# Patient Record
Sex: Female | Born: 1960 | Race: Black or African American | Hispanic: No | Marital: Married | Smoking: Never smoker
Health system: Southern US, Community
[De-identification: ages and names within clinical notes are randomized; demographics above are authoritative.]

## PROBLEM LIST (undated history)

## (undated) DIAGNOSIS — E785 Hyperlipidemia, unspecified: Secondary | ICD-10-CM

## (undated) DIAGNOSIS — Z9889 Other specified postprocedural states: Secondary | ICD-10-CM

## (undated) DIAGNOSIS — I10 Essential (primary) hypertension: Secondary | ICD-10-CM

## (undated) DIAGNOSIS — J189 Pneumonia, unspecified organism: Secondary | ICD-10-CM

## (undated) DIAGNOSIS — R112 Nausea with vomiting, unspecified: Secondary | ICD-10-CM

## (undated) DIAGNOSIS — E039 Hypothyroidism, unspecified: Secondary | ICD-10-CM

## (undated) DIAGNOSIS — R7303 Prediabetes: Secondary | ICD-10-CM

## (undated) DIAGNOSIS — E041 Nontoxic single thyroid nodule: Secondary | ICD-10-CM

## (undated) HISTORY — PX: APPENDECTOMY: SHX54

## (undated) HISTORY — PX: ABDOMINAL HYSTERECTOMY: SHX81

## (undated) HISTORY — DX: Hyperlipidemia, unspecified: E78.5

## (undated) HISTORY — DX: Nontoxic single thyroid nodule: E04.1

---

## 1997-09-23 ENCOUNTER — Other Ambulatory Visit: Admission: RE | Admit: 1997-09-23 | Discharge: 1997-09-23 | Payer: Self-pay | Admitting: Gynecology

## 1997-10-16 ENCOUNTER — Ambulatory Visit (HOSPITAL_COMMUNITY): Admission: RE | Admit: 1997-10-16 | Discharge: 1997-10-16 | Payer: Self-pay | Admitting: Gynecology

## 1998-12-15 ENCOUNTER — Other Ambulatory Visit: Admission: RE | Admit: 1998-12-15 | Discharge: 1998-12-15 | Payer: Self-pay | Admitting: Gynecology

## 1999-12-16 ENCOUNTER — Other Ambulatory Visit: Admission: RE | Admit: 1999-12-16 | Discharge: 1999-12-16 | Payer: Self-pay | Admitting: Gynecology

## 2001-05-08 ENCOUNTER — Other Ambulatory Visit: Admission: RE | Admit: 2001-05-08 | Discharge: 2001-05-08 | Payer: Self-pay | Admitting: Gynecology

## 2002-06-18 ENCOUNTER — Other Ambulatory Visit: Admission: RE | Admit: 2002-06-18 | Discharge: 2002-06-18 | Payer: Self-pay | Admitting: Gynecology

## 2002-07-26 ENCOUNTER — Encounter (INDEPENDENT_AMBULATORY_CARE_PROVIDER_SITE_OTHER): Payer: Self-pay | Admitting: Specialist

## 2002-07-26 ENCOUNTER — Ambulatory Visit (HOSPITAL_BASED_OUTPATIENT_CLINIC_OR_DEPARTMENT_OTHER): Admission: RE | Admit: 2002-07-26 | Discharge: 2002-07-26 | Payer: Self-pay | Admitting: Gynecology

## 2002-09-20 ENCOUNTER — Encounter (INDEPENDENT_AMBULATORY_CARE_PROVIDER_SITE_OTHER): Payer: Self-pay

## 2002-09-20 ENCOUNTER — Observation Stay (HOSPITAL_COMMUNITY): Admission: RE | Admit: 2002-09-20 | Discharge: 2002-09-21 | Payer: Self-pay | Admitting: Gynecology

## 2003-05-19 ENCOUNTER — Other Ambulatory Visit: Admission: RE | Admit: 2003-05-19 | Discharge: 2003-05-19 | Payer: Self-pay | Admitting: Gynecology

## 2004-05-19 ENCOUNTER — Other Ambulatory Visit: Admission: RE | Admit: 2004-05-19 | Discharge: 2004-05-19 | Payer: Self-pay | Admitting: Gynecology

## 2005-05-20 ENCOUNTER — Other Ambulatory Visit: Admission: RE | Admit: 2005-05-20 | Discharge: 2005-05-20 | Payer: Self-pay | Admitting: Gynecology

## 2005-07-12 ENCOUNTER — Encounter: Admission: RE | Admit: 2005-07-12 | Discharge: 2005-07-12 | Payer: Self-pay | Admitting: Gastroenterology

## 2006-01-10 ENCOUNTER — Encounter (INDEPENDENT_AMBULATORY_CARE_PROVIDER_SITE_OTHER): Payer: Self-pay | Admitting: Specialist

## 2006-01-10 ENCOUNTER — Other Ambulatory Visit: Admission: RE | Admit: 2006-01-10 | Discharge: 2006-01-10 | Payer: Self-pay | Admitting: Interventional Radiology

## 2006-01-10 ENCOUNTER — Encounter: Admission: RE | Admit: 2006-01-10 | Discharge: 2006-01-10 | Payer: Self-pay | Admitting: Emergency Medicine

## 2006-06-28 ENCOUNTER — Other Ambulatory Visit: Admission: RE | Admit: 2006-06-28 | Discharge: 2006-06-28 | Payer: Self-pay | Admitting: Gynecology

## 2007-06-29 ENCOUNTER — Other Ambulatory Visit: Admission: RE | Admit: 2007-06-29 | Discharge: 2007-06-29 | Payer: Self-pay | Admitting: Gynecology

## 2007-09-04 ENCOUNTER — Ambulatory Visit: Payer: Self-pay | Admitting: Gynecology

## 2007-10-05 ENCOUNTER — Ambulatory Visit: Payer: Self-pay | Admitting: Gynecology

## 2007-10-11 ENCOUNTER — Ambulatory Visit (HOSPITAL_BASED_OUTPATIENT_CLINIC_OR_DEPARTMENT_OTHER): Admission: RE | Admit: 2007-10-11 | Discharge: 2007-10-11 | Payer: Self-pay | Admitting: Gynecology

## 2007-10-11 ENCOUNTER — Encounter: Payer: Self-pay | Admitting: Gynecology

## 2007-10-11 ENCOUNTER — Ambulatory Visit: Payer: Self-pay | Admitting: Gynecology

## 2007-10-25 ENCOUNTER — Ambulatory Visit: Payer: Self-pay | Admitting: Gynecology

## 2008-01-17 IMAGING — US US BIOPSY
1 series · 6 of 6 positions shown · non-contrast
Comparison: none

CLINICAL DATA: Palpable thyroid nodule

ULTRASOUND GUIDED ASPIRATION BIOPSY THYROID:
TECHNIQUE: Overlying skin prepped with Betadine, draped in usual sterile
fashion, and infiltrated locally with 1% lidocaine. Under real-time ultrasound
guidance, 4 passes were made into the dominant solid left lower pole nodule
using 25 gauge needles. Samples were given to cytopathology. No immediate
complication.

[Series 1: unknown · 0.07mm/px · 6 of 6 slices shown]
[im 1/6]
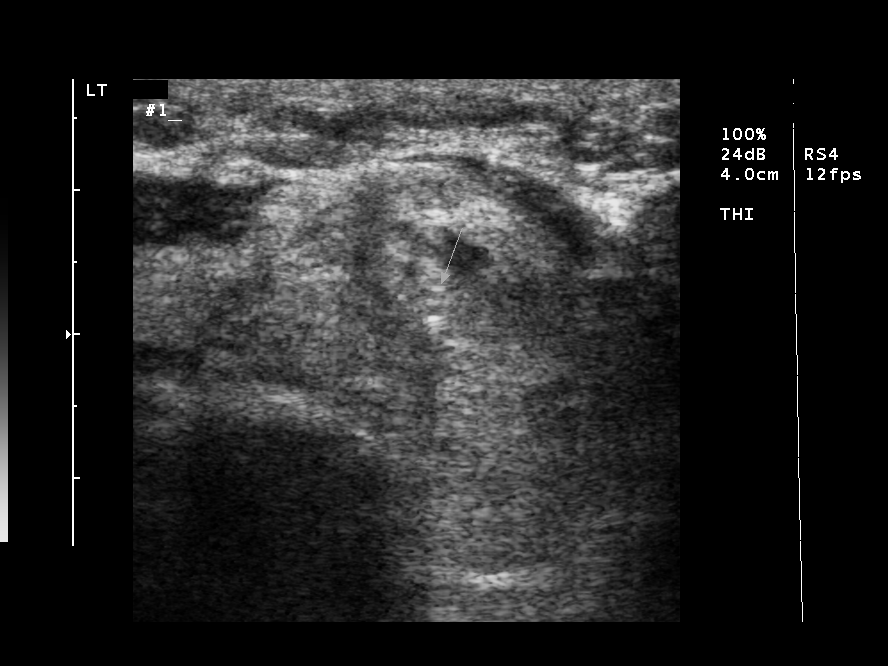
[im 2/6]
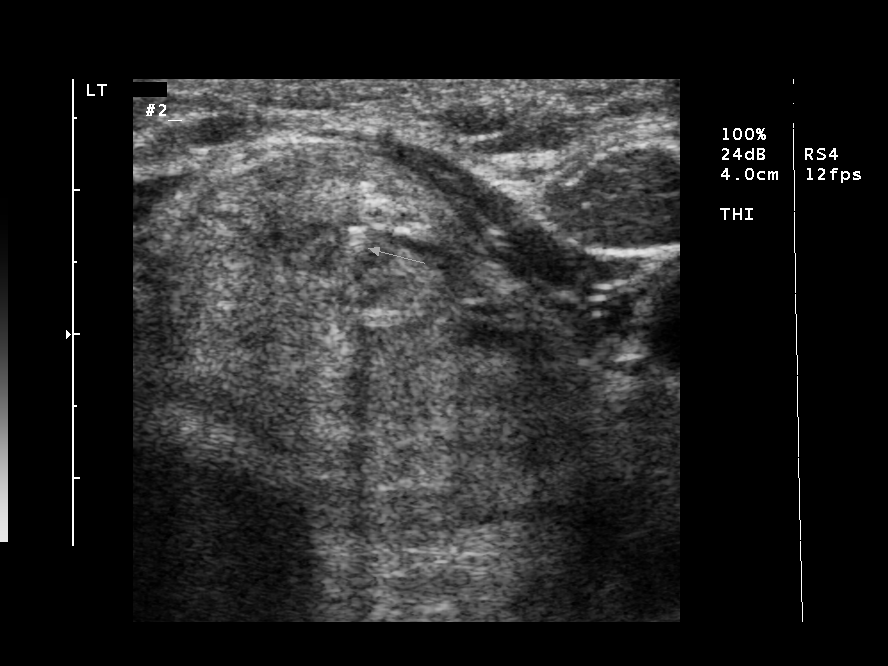
[im 3/6]
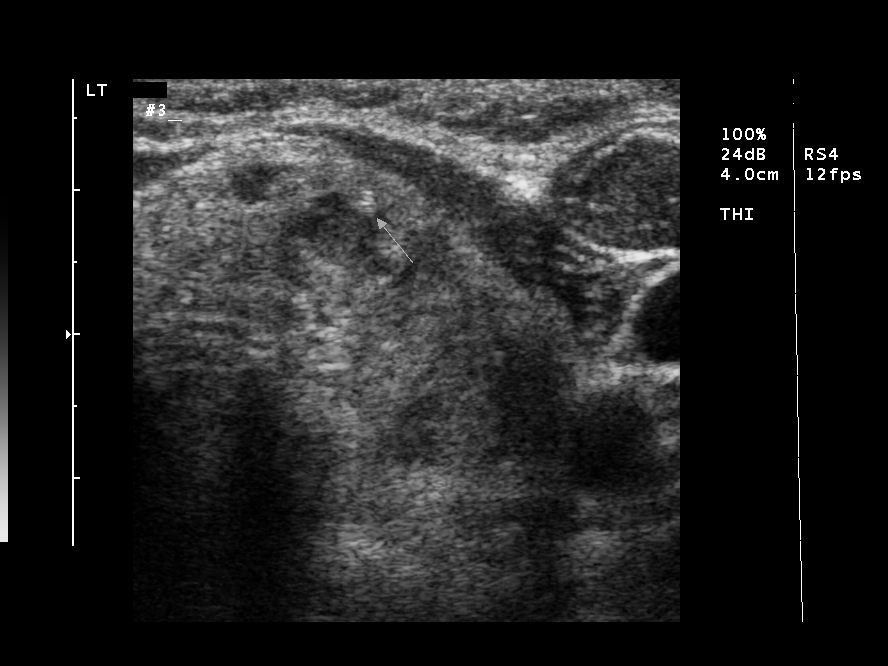
[im 4/6]
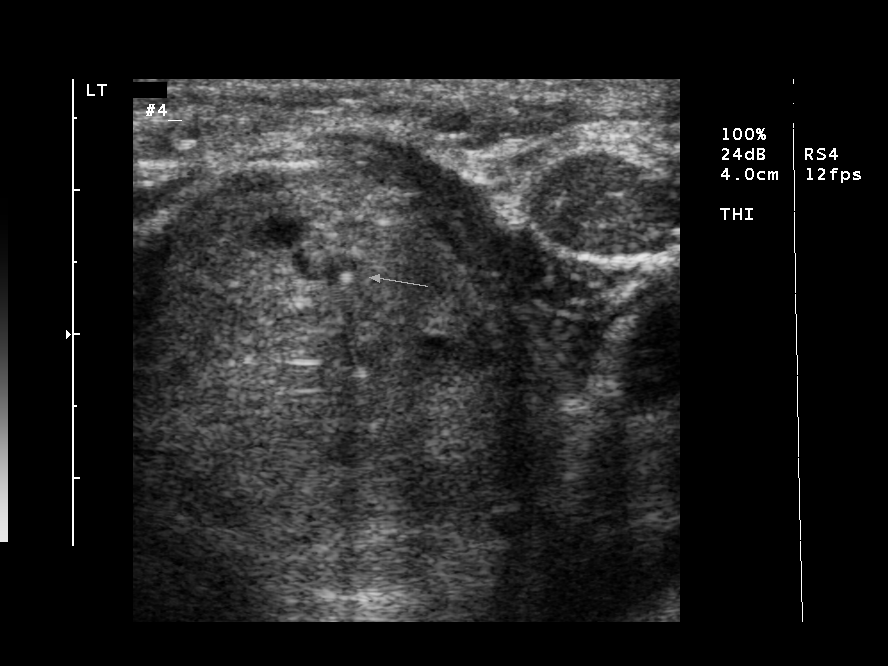
[im 5/6]
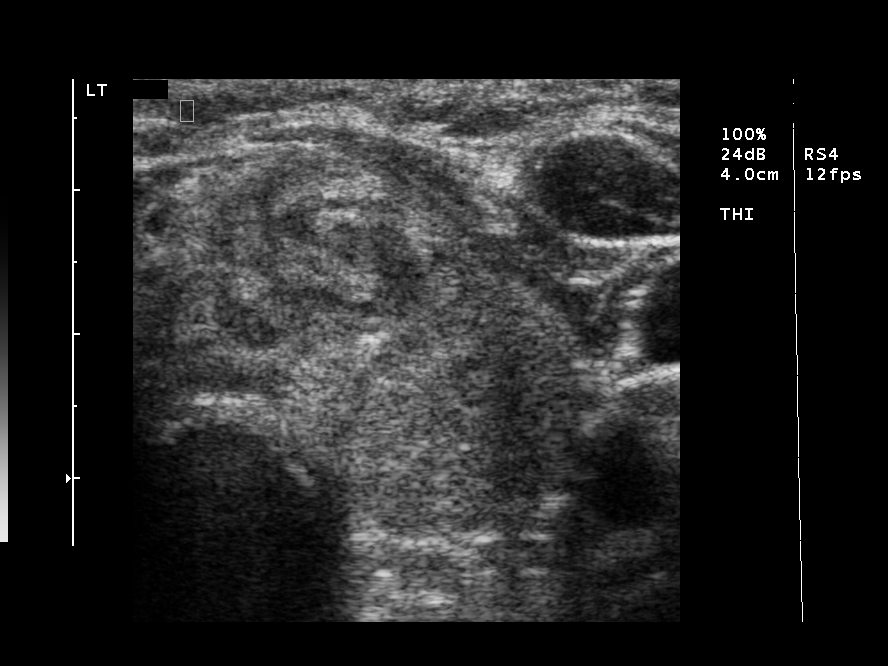
[im 6/6]
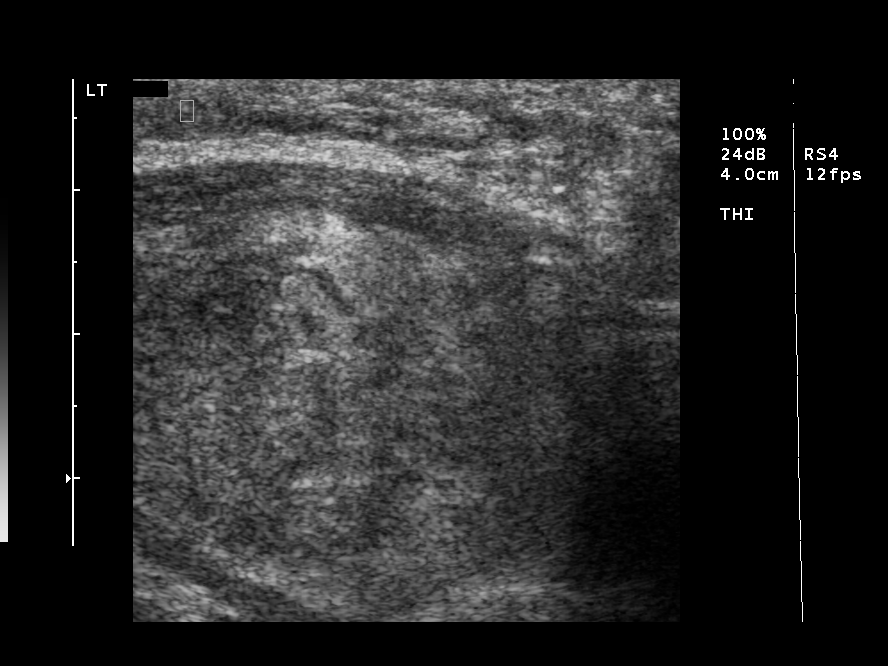

[6 of 6 positions shown; findings below may reference images not displayed]

IMPRESSION: Technically successful ultrasound guided FNA thyroid biopsy.

## 2008-07-09 ENCOUNTER — Encounter: Payer: Self-pay | Admitting: Gynecology

## 2008-07-09 ENCOUNTER — Ambulatory Visit: Payer: Self-pay | Admitting: Gynecology

## 2008-07-09 ENCOUNTER — Other Ambulatory Visit: Admission: RE | Admit: 2008-07-09 | Discharge: 2008-07-09 | Payer: Self-pay | Admitting: Gynecology

## 2009-07-10 ENCOUNTER — Other Ambulatory Visit: Admission: RE | Admit: 2009-07-10 | Discharge: 2009-07-10 | Payer: Self-pay | Admitting: Gynecology

## 2009-07-10 ENCOUNTER — Ambulatory Visit: Payer: Self-pay | Admitting: Gynecology

## 2009-07-17 ENCOUNTER — Ambulatory Visit (HOSPITAL_COMMUNITY): Admission: RE | Admit: 2009-07-17 | Discharge: 2009-07-17 | Payer: Self-pay | Admitting: Gynecology

## 2009-08-26 ENCOUNTER — Encounter: Admission: RE | Admit: 2009-08-26 | Discharge: 2009-08-26 | Payer: Self-pay | Admitting: Surgery

## 2009-08-26 ENCOUNTER — Other Ambulatory Visit: Admission: RE | Admit: 2009-08-26 | Discharge: 2009-08-26 | Payer: Self-pay | Admitting: Interventional Radiology

## 2010-02-19 ENCOUNTER — Other Ambulatory Visit: Payer: Self-pay | Admitting: Surgery

## 2010-02-19 DIAGNOSIS — E042 Nontoxic multinodular goiter: Secondary | ICD-10-CM

## 2010-04-09 ENCOUNTER — Ambulatory Visit
Admission: RE | Admit: 2010-04-09 | Discharge: 2010-04-09 | Disposition: A | Discharge status: Home / Self Care | Payer: BLUE CROSS/BLUE SHIELD | Source: Ambulatory Visit | Attending: Surgery | Admitting: Surgery

## 2010-04-09 DIAGNOSIS — E042 Nontoxic multinodular goiter: Secondary | ICD-10-CM

## 2010-04-21 ENCOUNTER — Other Ambulatory Visit: Payer: Self-pay | Admitting: Surgery

## 2010-04-21 DIAGNOSIS — E041 Nontoxic single thyroid nodule: Secondary | ICD-10-CM

## 2010-05-18 NOTE — Op Note (Signed)
[name], [name]               ACCOUNT NO.:  [id number]   MEDICAL RECORD NO.:  [id number]          PATIENT TYPE:  AMB   LOCATION:  [facility]                         FACILITY:  [facility]   PHYSICIAN:  Timothy P. Fontaine, M.D.DATE OF BIRTH:  05/10/1960   DATE OF PROCEDURE:  10/11/2007  DATE OF DISCHARGE:                               OPERATIVE REPORT   PREOPERATIVE DIAGNOSIS:  Pelvic pain.   POSTOPERATIVE DIAGNOSES:  Pelvic pain, left paratubal cyst.   PROCEDURE:  Laparoscopic biopsy, fulguration left paratubal cyst.   SURGEON:  Timothy P. Fontaine, M.D.   ANESTHETIC:  General.   SPECIMEN:  Left paratubal cyst.   ESTIMATED BLOOD LOSS:  Minimal.   COMPLICATIONS:  None.   FINDINGS:  EUA external/BUS/vagina normal.  Bimanual without masses.  Laparoscopic cul-de-sac normal with no evidence of endometriosis or  pelvic adhesive disease.  Right ovary and fallopian tube segment grossly  normal, free and mobile.  Left fallopian tube segment with small  paratubal cyst biopsied off and fulgurated.  Left ovary grossly normal,  adherent to the left pelvic sidewall.  Upper abdominal exam liver  smooth, no abnormalities.  Gallbladder grossly normal.  No upper  abdominal adhesive disease noted.  Right appendiceal region with cecum  with some pericecal adhesions to the right flank, otherwise normal.  Appendix not visualized.   PROCEDURE IN DETAIL:  The patient was taken to the operating room,  underwent general anesthesia, was placed in the low dorsal lithotomy  position, received an abdominal perineal vaginal preparation with  Betadine solution.  Bladder emptied with in-and-out Foley  catheterization.  EUA performed.  Sponge stick placed within the vagina  and the patient draped in the usual fashion.  A repeat infraumbilical  vertical incision was made.  The 10 mm OptiView direct entry port was  placed within the abdomen under direct visualization without difficulty.  The abdomen was  insufflated and a 5 mm suprapubic port was placed in the  midline under direct visualization after transillumination for the  vessels without difficulty.  Examination of pelvic organs and upper  abdominal exam was carried out with findings noted above.  The left  small paratubal cyst was biopsied off, sent to pathology and the  surrounding serosa was bipolar cauterized.  The suprapubic port was then  removed.  Hemostasis visualized at the port site.  The gas allowed to  escape.  The infraumbilical port removed under direct visualization  showing adequate hemostasis and no evidence of hernia formation.  Both  port site and skin incisions were injected using 0.25% Marcaine.  A zero  Vicryl interrupted subcutaneous fascial stitch placed infraumbilically  and both skin incisions closed using Dermabond skin  adhesive.  The sponge stick was removed from the vagina.  The patient  placed in supine position and received Toradol intraoperatively and was  awakened without difficulty and taken to recovery room in good condition  having tolerated the procedure well.      Timothy P. Fontaine, M.D.  Electronically Signed     TPF/MEDQ  D:  10/11/2007  T:  10/11/2007  Job:  [id number]

## 2010-05-18 NOTE — H&P (Signed)
NAMESORCHA, Stacie [name]               ACCOUNT NO.:  [id number]   MEDICAL RECORD NO.:  [id number]          PATIENT TYPE:  AMB   LOCATION:  [facility]                         FACILITY:  [facility]   PHYSICIAN:  Timothy P. Fontaine, M.D.DATE OF BIRTH:  [birth year]   DATE OF ADMISSION:  10/11/2007  DATE OF DISCHARGE:                              HISTORY & PHYSICAL   Being admitted to [facility] October 11, 2007, 7:30 for  surgery.   CHIEF COMPLAINT:  Pelvic right-sided pain.   HISTORY OF PRESENT ILLNESS:  [age]-year-old G2, P2 female status  post TVH for leiomyoma complex hyperplasia with history of right-sided  aching to nagging pelvic pain for approximately a year.  The patient  notes that it occurs almost on a daily basis 3/4 weeks, nagging aching  pain.  No constipation, diarrhea or other of abdominal symptoms.  She  does note that the pain seems worse with a full bladder, but that she  has the pain without necessarily bladder symptoms.  The patient denies  dyspareunia.  She is admitted at this time for diagnostic laparoscopy,  rule out pelvic adhesive disease or endometriosis.   PAST SURGICAL HISTORY:  1. Laparoscopic tubal sterilization.  2. Appendectomy.  3. Hysteroscopic myomectomy.  4. Total vaginal hysterectomy.   PAST MEDICAL HISTORY:  History of elevated cholesterol.   CURRENT MEDICATIONS:  None.   ALLERGIES:  No medications.   REVIEW OF SYSTEMS:  Noncontributory.   FAMILY HISTORY:  Noncontributory.   SOCIAL HISTORY:  Noncontributory.   ADMISSION PHYSICAL EXAMINATION:  VITAL SIGNS:  Afebrile vital signs  stable.  HEENT: Normal.  LUNGS:  Clear.  CARDIAC:  Regular rate, no rubs, murmurs or gallops.  ABDOMINAL EXAM:  Benign.  PELVIC:  External BUS, vagina normal.  Bimanual without masses or  tenderness.   ASSESSMENT:  [age]-year-old female with nagging right-sided pelvic  pain, history of vaginal hysterectomy, appendectomy, laparoscopic tubal  in  the past for diagnostic laparoscopy.  Various pathology possibilities  to include adhesions, endometriosis, as well as no pathology found or  reviewed as well as nongynecologic possibilities to include interstitial  cystitis.  Options for urologic evaluation first versus laparoscopy  first was reviewed, and as the patient does have this pain in the  absence of urinary symptoms she would prefer a laparoscopy first, but  there is a possibility of urologic referral later if the pain would  continue or worsen following the procedure.  She understands there are  no guarantees as far as pain relief, that her pain may worsen, change,  persist following the procedure.  The procedure itself was discussed to  include laparoscopy, trocar placement, multiple port sites, sharp/blunt  dissection, electrocautery, harmonic scalpel.  The acute  intraoperative/postoperative risks were reviewed to include infection,  incisional complications requiring opening and draining of incisions,  closure by secondary intention, long-term issues with incisions to  include hernia formation, scar formation, cosmetics all reviewed with  her.  The risk of hemorrhage necessitating transfusion and the risks of  transfusion including transfusion reaction, hepatitis, HIV, mad cow  disease and other unknown entities was reviewed,  as well as the risk of  inadvertent injury to internal organs including bowel, bladder, ureters,  vessels and nerves either immediately recognized or delay recognized  necessitating major exploratory reparative surgeries, future reparative  surgeries, bladder repair, ureteral damage repair, bowel resection,  ostomy formation was all discussed, understood and accepted.  Again, the  patient clearly understands that there are no guarantees as far as pain  relief, and she understands and accepts this.  Her questions were  answered and she is ready to proceed with surgery.      Timothy P. Fontaine,  M.D.  Electronically Signed     TPF/MEDQ  D:  10/05/2007  T:  10/07/2007  Job:  [id number]

## 2010-05-21 NOTE — Op Note (Signed)
NAME:  Stacie [name], Stacie [name]                         ACCOUNT NO.:  [id number]   MEDICAL RECORD NO.:  [id number]                    PATIENT TYPE:  AMB   LOCATION:  [facility]                                 FACILITY:  [facility]   PHYSICIAN:  Timothy P. Fontaine, M.D.           DATE OF BIRTH:  1960-04-02   DATE OF PROCEDURE:  07/26/2002  DATE OF DISCHARGE:                                 OPERATIVE REPORT   PREOPERATIVE DIAGNOSES:  1. Menorrhagia.  2. Endometrial polyp.  3. Submucous myoma.   POSTOPERATIVE DIAGNOSES:  1. Menorrhagia.  2. Endometrial polyp.  3. Submucous myoma.   PROCEDURES:  1. Resection endometrial polyps x 2.  2. Submucous myomectomy, resectoscopic.  3. Uterine curettage.   SURGEON:  Timothy P. Fontaine, M.D.   ANESTHETIC:  General.   ESTIMATED BLOOD LOSS:  Minimal.   COMPLICATIONS:  None.   SORBITOL DISCREPANCY:  475 mL.   FINDINGS:  Small endometrial polyp x 2, right posterior aspect fundus,  submucous myoma left fundal.  Remainder of hysteroscopy normal, noting  fundus, anterior-posterior uterine surfaces, lower uterine segment,  endocervical canal all visualized.  Right tubal ostia visualized.  Left  tubal ostia not visualized.   DESCRIPTION OF PROCEDURE:  The patient was taken to the operating room,  underwent general anesthesia, was placed in the dorsal lithotomy position,  received a perineal/vaginal preparation of Betadine solution, bladder  emptied with in and out Foley catheterization, EUA performed, the patient  draped in the usual fashion.  The anterior lip of the cervix was grasped  with a single-tooth tenaculum.  Endocervix was gently gradually dilated to  admit the operative hysteroscope.  Hysteroscopy was performed with findings  noted above.  The two endometrial polyps were identified and subsequently  resected at their base with the right-angle resectoscopic loop.  Subsequent  attention was then turned to the myoma which was protruding into the  cavity,  obscuring the left cornual region.  The myoma was then resected in its  entirety to progressive passes with the right-angle resectoscopic loop.  Subsequent decompression with removal of the fragments and then re-  extensioned with continued resection of the remaining myoma.  At the end of  the procedure, the myoma was shelled out with the loop and felt to be  removed in its entirety.  A sharp curettage was then performed; subsequent  hysteroscopy showed an empty cavity, good distention, no  evidence of perforation, and adequate hemostasis.  The instruments were  removed, the tenaculum removed, the adequate hemostasis visualized.  The  patient was placed in a supine position, awakened without difficulty, and  taken to the recovery room in good condition having tolerated the procedure  well.  Timothy P. Audie Box, M.D.    TPF/MEDQ  D:  07/26/2002  T:  07/26/2002  Job:  [id number]

## 2010-05-21 NOTE — H&P (Signed)
NAME:  Stacie [name], Stacie [name]                         ACCOUNT NO.:  [id number]   MEDICAL RECORD NO.:  [id number]                   PATIENT TYPE:  INP   LOCATION:  NA                                   FACILITY:  [facility]   PHYSICIAN:  Timothy P. Fontaine, M.D.           DATE OF BIRTH:  1960-07-15   DATE OF ADMISSION:  09/20/2002  DATE OF DISCHARGE:                                HISTORY & PHYSICAL   CHIEF COMPLAINT:  Atypical complex hyperplasia of the uterus.   HISTORY OF PRESENT ILLNESS:  This is a 50 year old, G 2, P 2, female status  post laparoscopic tubal sterilization who was initially evaluated for  increasing menorrhagia.  She underwent ultrasound evaluation which showed  multiple small myomas and a hysterogram which showed two anterior wall  defects and a posterior wall defect suggestive of submucous myoma as well as  endometrial polyp.  The patient underwent hysteroscopic resection and D&C  July 26, 2002, with pathology revealing focal atypical complex hyperplasia  and submucous leiomyomata.   Her history is significant for regular menses without a history of  anovulation.  The various options of management were reviewed with her to  include expectant management with resampling as well as progesterone therapy  and resampling up to and including hysterectomy.  Given her situation, the  patient and I both feel that it is prudent to proceed with hysterectomy to  avoid endometrial carcinoma.  Given that she has complex hyperplasia with  atypia with regular menses, the need for continued surveillance biopsies  throughout the remainder of her life was reviewed and we both feel  comfortable with proceeding with hysterectomy.   PAST MEDICAL HISTORY:  Uncomplicated.   PAST SURGICAL HISTORY:  1. Appendectomy.  2. Laparoscopic tubal sterilization.   ALLERGIES:  None.   MEDICATIONS:  None.   REVIEW OF SYSTEMS:  Noncontributory.   FAMILY HISTORY:  Noncontributory.   PHYSICAL  EXAMINATION:  VITAL SIGNS:  Afebrile, vital signs are stable.  HEENT:  Normal.  LUNGS:  Clear.  CARDIAC:  Regular rate without rubs, murmurs, or gallops.  ABDOMEN:  Benign.  PELVIC:  External, BUS, vagina:  Normal.  Cervix:  Grossly normal.  Uterus:  Irregular, overall grossly normal in size, consistent with myomas.  Adnexa  without masses or tenderness.   ASSESSMENT/PLAN:  This is a 50 year old, gravida 2, para 2, female status  post tubal sterilization, multiple small myomas, history of increasing  menorrhagia, hysteroscopic evaluation revealing complex hyperplasia with  atypia for total vaginal hysterectomy and possible laparoscopic-assisted  vaginal hysterectomy.   I have reviewed the surgery with her to include the expected intraoperative  and postoperative courses.  The risks, benefits, indications, and  alternatives were reviewed with her.  The patient understands that we may  ultimately need to proceed with an abdominal approach if either a direct  vaginal approach or laparoscopic-assisted vaginal approach is not possible.  The ovarian conservation issue  was discussed with her and the options of  keeping  both ovaries or having them removed were reviewed with her.  She  understands with keeping both ovaries she is at risk for ovarian cancer in  the future as well as benign ovarian disease which will require further  evaluation and surgery versus removing her ovaries and the issues of  estrogen deprivation and possible estrogen replacement therapy.  The patient  prefers to keep both ovaries although she accepts the risks and gives me  permission to remove one or both ovaries if significant disease is  encountered at the time of surgery.   The acute operative risks of the surgery were reviewed with her to include  the risk of bleeding, transfusion and the risks of transfusion to include  transfusion reaction, hepatitis, HIV, mad cow disease, and other unknown  entities.  The  risk of infection, both internal requiring prolonged  antibiotics, abscess formation requiring reoperation drainage of abscesses,  as well as incisional infections and wound complications requiring opening  and draining of incisions if indeed abdominal incisions are made, were  reviewed with her.  The risk of inadvertant injury to internal organs  including bowel, bladder, ureters, vessels, and nerves necessitating major  exploratory reparative surgeries and future reparative surgeries including  ostomy formation was reviewed with her.  Sexuality following hysterectomy  was also reviewed with her and the potential for orgasmic dysfunction as  well as persistent dyspareunia was discussed, understood, and accepted.  Finally, the absolute and irreversible sterility associated with  hysterectomy was reviewed with her.  The patient's questions are answered to  her satisfaction.  She is ready to proceed with surgery.                                               Timothy P. Audie Box, M.D.    TPF/MEDQ  D:  09/17/2002  T:  09/17/2002  Job:  [id number]

## 2010-05-21 NOTE — Op Note (Signed)
NAME:  Stacie [name], Stacie [name]                         ACCOUNT NO.:  [id number]   MEDICAL RECORD NO.:  [id number]                   PATIENT TYPE:  INP   LOCATION:  9304                                 FACILITY:  [facility]   PHYSICIAN:  Timothy P. Fontaine, M.D.           DATE OF BIRTH:  [birth year]   DATE OF PROCEDURE:  09/20/2002  DATE OF DISCHARGE:                                 OPERATIVE REPORT   PREOPERATIVE DIAGNOSIS:  Leiomyomata, atypical, complex, endometrial  hyperplasia.   POSTOPERATIVE DIAGNOSIS:  Leiomyomata, atypical, complex, endometrial  hyperplasia.   PROCEDURE:  Total vaginal hysterectomy.   SURGEON:  Timothy P. Fontaine, M.D.   ASSISTANT:  Rande Brunt. Eda Paschal, M.D.   ANESTHESIA:  General endotracheal anesthesia.   ESTIMATED BLOOD LOSS:  100 mL.   COMPLICATIONS:  None.   SPECIMENS:  Uterus.   FINDINGS:  The uterus was enlarged with multiple small myomas.  Right and  left fallopian tube segments grossly normal.  Right and left ovaries grossly  normal.  No evidence of pelvic adhesions for endometriosis.   PROCEDURE:  The patient was taken to the operating room, underwent general  anesthesia, was placed in the dorsal lithotomy position and received a  perineal vaginal preparation with Betadine scrub and Betadine solution.  The  bladder was emptied with in and out Foley catheterization performed by the  nursing personnel.  The patient was draped in the usual fashion.  The cervix  was visualized with a speculum, grasped with a tenaculum and  circumferentially injected using lidocaine and epinephrine mixture,  approximately 10 mL total.  The cervical mucosa was then circumferentially  incised and the paracervical planes were sharply developed.  The posterior  cul-de-sac was then entered sharply without difficulty and a long weighted  speculum was placed.  The right and left uterosacral ligaments were  identified, clamped, cut, and ligated with 0 Vicryl suture, and tagged  for  future reference.  The anterior cul-de-sac was then sharply developed and  subsequently sharply entered without difficulty.  The uterus was then  progressively freed from its attachments through clamp, cut, and tie of the  paracervical and parametrial tissues and cardinal ligaments using 0 Vicryl  suture.  The uterine vessels were identified and doubly ligated using 0  Vicryl suture.  The uterus was then delivered through the vagina.  Due to  the bulky nature, initial morcellation was performed to allow visualization  of the uterine ovarian pedicles bilaterally.  The uterine ovarian pedicles  were then doubly clamped, cut, and the uterus was removed from the patient.  The ovarian pedicles were then doubly ligated with 0 Vicryl suture and  tagged for future reference.  The long weighted speculum was then removed  and replaced with a shorter weighted speculum.  Irrigation was applied and  adequate hemostasis was initially visualized.  The posterior vaginal cuff  was then run from uterosacral ligament to uterosacral ligament using 0  Vicryl suture in a running interlocking stitch.  Both ovarian pedicles were  then reinspected, adequate hemostasis visualized, and the sutures tagged and  cut.  The packing was removed and the vagina was subsequently closed  anteriorly to posteriorly using 0 Vicryl suture in a figure-of-eight stitch.  The vagina was irrigated and adequate hemostasis was visualized.  A Foley  catheter was placed and free flowing clear, yellow urine was noted.  The  patient was placed in supine position, awakened without difficulty, and  taken to the recovery room in good condition, having tolerated the procedure  well.                                               Timothy P. Audie Box, M.D.    TPF/MEDQ  D:  09/20/2002  T:  09/20/2002  Job:  [id number]

## 2010-05-21 NOTE — Discharge Summary (Signed)
   NAME:  Stacie [name], Stacie [name]                         ACCOUNT NO.:  [id number]   MEDICAL RECORD NO.:  [id number]                   PATIENT TYPE:  OBV   LOCATION:  9304                                 FACILITY:  [facility]   PHYSICIAN:  Timothy P. Fontaine, M.D.           DATE OF BIRTH:  [birth year]   DATE OF ADMISSION:  09/20/2002  DATE OF DISCHARGE:  09/21/2002                                 DISCHARGE SUMMARY   DISCHARGE DIAGNOSIS:  Leiomyomata uterus with endometrial hyperplasia.   PROCEDURE:  Vaginal hysterectomy.   HISTORY OF PRESENT ILLNESS:  [name] gravida 2, para 2 with history of  increasing menorrhagia, who had an ultrasound that did show multiple small  myomas and hysterogram which showed two anterior wall defects and a  posterior wall defect suggestive of a submucous myoma as well as an  endometrial polyp.  Hysteroscopic resection and D&C on July 26, 2002, with  pathology revealing focal atypical complex hyperplasia and leiomyomata.   HOSPITAL COURSE:  The patient was admitted on September 20, 2002, for a  vaginal hysterectomy.  She did well postoperatively, remained afebrile,  voiding, and was discharged home in satisfactory condition on her first  postoperative day.   LABORATORY DATA:  White count was 7.3, hemoglobin 12.1, hematocrit 35.7,  platelets 468.   DISPOSITION:  The patient was discharged home the first postoperative day.  Instructions to follow up at the office in two weeks.   DISCHARGE MEDICATIONS:  Prescription for Lortab was given with instructions.     Davonna Belling. Young, N.P.                      Timothy P. Audie Box, M.D.    [facility]  D:  10/09/2002  T:  10/09/2002  Job:  [id number]

## 2010-05-21 NOTE — H&P (Signed)
NAME:  Stacie [name], ROBERSON                         ACCOUNT NO.:  [id number]   MEDICAL RECORD NO.:  [id number]                   PATIENT TYPE:  AMB   LOCATION:  [facility]                                 FACILITY:  [facility]   PHYSICIAN:  Timothy P. Fontaine, M.D.           DATE OF BIRTH:  [birth year]   DATE OF ADMISSION:  07/26/2002  DATE OF DISCHARGE:                                HISTORY & PHYSICAL   CHIEF COMPLAINT:  Menorrhagia.  Being admitted to [facility] on  26 July 2002, at 7:30 for surgery.   HISTORY OF PRESENT ILLNESS:  [name], G2, P2 female, status post  laparoscopic tubal sterilization, who presents with increasing menorrhagia.  The patient underwent ultrasound evaluation which shows multiple small  myomas and subsequent sonohysterogram which showed two anterior wall defects  and one posterior wall defect, measuring 12 x 9 mm suggestive of a polyp, 17  mm suggestive of a fibroid, and 7 mm suggestive of a polyp.  The patient is  admitted at this time for hysteroscopy, resection of the endometrial  defects, D&C.   PAST MEDICAL HISTORY:  Uncomplicated.   PAST SURGICAL HISTORY:  1. Laparoscopic tubal sterilization.  2. Appendectomy.   ALLERGIES:  None.   MEDICATIONS:  None.   REVIEW OF SYSTEMS:  Noncontributory.   FAMILY HISTORY:  Noncontributory.   ADMISSION PHYSICAL EXAMINATION:  VITAL SIGNS:  Afebrile.  Vital signs are  stable.  HEENT:  Normal.  LUNGS:  Clear.  CARDIAC:  Regular rate without rubs, murmurs, or gallops.  ABDOMEN:  Benign.  PELVIC:  External BUS vagina normal.  Cervix grossly normal.  Uterus  irregular, consistent with myomas.  Grossly normal in size.  Adnexa without  gross masses or tenderness.   ASSESSMENT:  [name], G2, P2 female, status post laparoscopic tubal  sterilization with increasing menorrhagia.  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  The patient is admitted at this time for  hysteroscopic evaluation, resection of these abnormalities, D&C.  The risks,  benefits, indications and alternatives for the procedure were reviewed with  the patient to include the expected intraoperative, postoperative courses.  Instrumentation, use of hysteroscope, resectoscope was also discussed, and  the risks of the procedure were reviewed to include the risks of bleeding,  transfusion, infection, uterine perforation, damage to internal organs  including bowel, bladder, ureters, vessels, and nerves necessitating major  exploratory reparative surgeries and future reparative surgeries including  ostomy formation.  The patient understands that not all of her fibroids are  being removed but just those that are intracavitary that are accessible to  the resectoscope and I felt safe to do at that time, and she understands  there are no guarantees as far bleeding relief, that she may continue to  have menorrhagia due to the other fibroids or hormonal dysfunction following  the procedure, and she  understands and accepts this.  The patient's  questions are answered to her satisfaction.  She is ready to proceed with  surgery.                                               Timothy P. Audie Box, M.D.    TPF/MEDQ  D:  07/25/2002  T:  07/25/2002  Job:  [id number]

## 2010-10-18 ENCOUNTER — Ambulatory Visit
Admission: RE | Admit: 2010-10-18 | Discharge: 2010-10-18 | Disposition: A | Discharge status: Home / Self Care | Payer: BLUE CROSS/BLUE SHIELD | Source: Ambulatory Visit | Attending: Surgery | Admitting: Surgery

## 2010-10-18 DIAGNOSIS — E041 Nontoxic single thyroid nodule: Secondary | ICD-10-CM

## 2010-11-01 ENCOUNTER — Ambulatory Visit (INDEPENDENT_AMBULATORY_CARE_PROVIDER_SITE_OTHER): Payer: BLUE CROSS/BLUE SHIELD | Admitting: Surgery

## 2010-11-01 ENCOUNTER — Encounter (INDEPENDENT_AMBULATORY_CARE_PROVIDER_SITE_OTHER): Payer: Self-pay | Admitting: Surgery

## 2010-11-01 VITALS — BP 120/64 | HR 75 | Temp 97.0°F | Resp 16 | Ht 65.0 in | Wt 164.8 lb

## 2010-11-01 DIAGNOSIS — E042 Nontoxic multinodular goiter: Secondary | ICD-10-CM | POA: Insufficient documentation

## 2010-11-01 NOTE — Progress Notes (Signed)
Visit Diagnoses: 1. Multinodular goiter (nontoxic)     HISTORY: Patient is a 50 year old black female followed for bilateral thyroid nodules. At my request she underwent a followup thyroid ultrasound in October 2012. This shows a small nodule on the right side measuring 1.5 cm and is unchanged. The dominant nodule was in the left thyroid lobe measuring 4.8 cm. This appears to be a conglomeration of multiple nodular areas with both cystic and solid components. It is also unchanged compared to her prior study. Previous fine needle aspiration biopsy cytopathology has been benign.   PERTINENT REVIEW OF SYSTEMS: Patient denies any change in compressive symptoms. She has occasional positional discomfort. She denies any significant dysphagia. She denies any dyspnea. She denies tremors. She denies palpitations.   EXAM: HEENT: normocephalic; pupils equal and reactive; sclerae clear; dentition good; mucous membranes moist NECK:  Anterior exam shows an obviously enlarged left thyroid lobe. There is no tracheal deviation. Palpation reveals a smooth nontender mass in the left thyroid lobe occupying most of the lobe. The right thyroid lobe was without significant nodularity; asymmetric on extension; no palpable anterior or posterior cervical lymphadenopathy; no supraclavicular masses; no tenderness CHEST: clear to auscultation bilaterally without rales, rhonchi, or wheezes CARDIAC: regular rate and rhythm without significant murmur; peripheral pulses are full EXT:  non-tender without edema; no deformity NEURO: no gross focal deficits; no sign of tremor   IMPRESSION: Multinodular thyroid with dominant left thyroid lobe nodule, 4.8 cm, clinically stable. Minimal compressive symptoms. Benign cytopathology.   PLAN: The patient and I again discussed these findings. I explained to her that there is no absolute indication for thyroidectomy. Her compressive symptoms are minimal. At this point she does not desire  surgery and would continue with close observation. I would like to see her back in one year. We will have a thyroid ultrasound performed prior to that office visit. We will also check a TSH level at that time.   Velora Heckler, MD, FACS General & Endocrine Surgery [facility]

## 2010-11-05 ENCOUNTER — Encounter: Payer: Self-pay | Admitting: Gynecology

## 2011-11-01 ENCOUNTER — Ambulatory Visit
Admission: RE | Admit: 2011-11-01 | Discharge: 2011-11-01 | Disposition: A | Discharge status: Home / Self Care | Payer: PRIVATE HEALTH INSURANCE | Source: Ambulatory Visit | Attending: Surgery | Admitting: Surgery

## 2011-11-01 DIAGNOSIS — E042 Nontoxic multinodular goiter: Secondary | ICD-10-CM

## 2011-11-07 ENCOUNTER — Encounter: Payer: Self-pay | Admitting: Gynecology

## 2011-12-05 ENCOUNTER — Ambulatory Visit (INDEPENDENT_AMBULATORY_CARE_PROVIDER_SITE_OTHER): Payer: PRIVATE HEALTH INSURANCE | Admitting: Surgery

## 2011-12-05 ENCOUNTER — Encounter (INDEPENDENT_AMBULATORY_CARE_PROVIDER_SITE_OTHER): Payer: Self-pay | Admitting: Surgery

## 2011-12-05 VITALS — BP 120/78 | HR 78 | Temp 97.6°F | Resp 12 | Ht 65.0 in | Wt 170.0 lb

## 2011-12-05 DIAGNOSIS — E042 Nontoxic multinodular goiter: Secondary | ICD-10-CM

## 2011-12-05 NOTE — Progress Notes (Signed)
General Surgery [facility]  Visit Diagnoses: 1. Multinodular goiter (nontoxic)     HISTORY: Patient is a 51 year old black female followed in my practice for the past few years with multinodular goiter. She has a dominant mass in the left thyroid lobe which has progressively enlarged. In August of 2011 it measured 3.7 cm. In April of 2012 it measured 4.8 cm. On ultrasound examination dated 11/01/2011 the mass now measures 5.2 x 3.2 x 4.7 cm. Cytopathology was previously benign. There is a small nodule in the right thyroid lobe measuring 1.7 cm.  PERTINENT REVIEW OF SYSTEMS: The patient denies compressive symptoms. She denies tremors. She denies palpitations. Her weight is stable.  EXAM: HEENT: normocephalic; pupils equal and reactive; sclerae clear; dentition good; mucous membranes moist NECK:  Dominant firm rubbery mass left thyroid lobe measuring approximately 4-5 cm in greatest dimension, nontender; right lobe without palpable abnormality; asymmetric on extension; no palpable anterior or posterior cervical lymphadenopathy; no supraclavicular masses; no tenderness CHEST: clear to auscultation bilaterally without rales, rhonchi, or wheezes CARDIAC: regular rate and rhythm without significant murmur; peripheral pulses are full EXT:  non-tender without edema; no deformity NEURO: no gross focal deficits; no sign of tremor   IMPRESSION: Multinodular goiter, dominant left thyroid nodule, 5.2 cm, with interval enlargement  PLAN: Patient and I discussed these findings. I have recommended that she consider thyroidectomy do to the progressive enlargement of the dominant nodule in the left thyroid lobe. Certainly this is not an emergency as she has no compressive symptoms. She does have some concerns both at work and at home presently. She would like to contact me regarding a convenient date for surgery in the not too distant future.  We will await the patient's call in order  to schedule her for thyroidectomy. If she elects not to proceed with thyroidectomy at this time, then we will see her back in one year with a followup thyroid ultrasound at that time.  Velora Heckler, MD, [facility] Office: [phone]

## 2011-12-05 NOTE — Patient Instructions (Signed)
  THYROID & PARATHYROID SURGERY - POST OP INSTRUCTIONS  Always review your discharge instruction sheet from the facility where your surgery was performed.  A prescription for pain medication may be given to you upon discharge.  Take your pain medication as prescribed.  If narcotic pain medicine is not needed, then you may take acetaminophen (Tylenol) or ibuprofen (Advil) as needed.  Take your usually prescribed medications unless otherwise directed.  If you need a refill on your pain medication, please contact your pharmacy. They will contact our office to request authorization.  Prescriptions will not be processed after 5 pm or on weekends.  Start with a light diet upon arrival home, such as soup and crackers or toast.  Be sure to drink plenty of fluids daily.  Resume your normal diet the day after surgery.  Most patients will experience some swelling and bruising on the chest and neck area.  Ice packs will help.  Swelling and bruising can take several days to resolve.   It is common to experience some constipation if taking pain medication after surgery.  Increasing fluid intake and taking a stool softener will usually help or prevent this problem.  A mild laxative (Milk of Magnesia or Miralax) should be taken according to package directions if there are no bowel movements after 48 hours.  You may remove your bandages 24-48 hours after surgery, and you may shower at that time.  You have steri-strips (small skin tapes) in place directly over the incision.  These strips should be left on the skin for 7-10 days and then removed.  You may resume regular (light) daily activities beginning the next day-such as daily self-care, walking, climbing stairs-gradually increasing activities as tolerated.  You may have sexual intercourse when it is comfortable.  Refrain from any heavy lifting or straining until approved by your doctor.  You may drive when you no longer are taking prescription pain medication,  you can comfortably wear a seatbelt, and you can safely maneuver your car and apply brakes.  You should see your doctor in the office for a follow-up appointment approximately two to three weeks after your surgery.  Make sure that you call for this appointment within a day or two after you arrive home to insure a convenient appointment time.  WHEN TO CALL YOUR DOCTOR: 1. Fever greater than 101.5 2. Inability to urinate 3. Nausea and/or vomiting - persistent 4. Extreme swelling or bruising 5. Continued bleeding from incision 6. Increased pain, redness, or drainage from the incision 7. Difficulty swallowing or breathing 8. Muscle cramping or spasms 9. Numbness or tingling in hands or around lips  The clinic staff is available to answer your questions during regular business hours.  Please don't hesitate to call and ask to speak to one of the nurses if you have concerns.  [email or web address]  [facility]     Office: [phone]  

## 2012-01-27 ENCOUNTER — Other Ambulatory Visit (INDEPENDENT_AMBULATORY_CARE_PROVIDER_SITE_OTHER): Payer: Self-pay | Admitting: Surgery

## 2012-01-27 ENCOUNTER — Telehealth (INDEPENDENT_AMBULATORY_CARE_PROVIDER_SITE_OTHER): Payer: Self-pay

## 2012-01-27 DIAGNOSIS — E042 Nontoxic multinodular goiter: Secondary | ICD-10-CM

## 2012-01-27 NOTE — Telephone Encounter (Signed)
Surgery scheduling sheet to surgery schedulers. Orders in epic per [facility].

## 2012-02-23 ENCOUNTER — Ambulatory Visit: Admit: 2012-02-23 | Payer: Self-pay | Admitting: Surgery

## 2012-02-23 SURGERY — THYROIDECTOMY
Anesthesia: General

## 2012-03-28 ENCOUNTER — Telehealth (INDEPENDENT_AMBULATORY_CARE_PROVIDER_SITE_OTHER): Payer: Self-pay

## 2012-03-28 NOTE — Telephone Encounter (Signed)
Message copied by Joanette Gula on Wed Mar 28, 2012  9:26 AM ------      Message from: Zacarias Pontes      Created: Tue Mar 27, 2012  9:59 AM       Pt has a reck apt on 4/17 w/TG for thyroid reck and sched sx...she wants to be seen sooner and to have her sx before end of April.She said she would even see another doc if she can have sx by end of April.Marland Kitchen she would like a call back from you please ------

## 2012-03-28 NOTE — Telephone Encounter (Signed)
msg sent to Dr Gerrit Friends to review and advise.

## 2012-03-30 ENCOUNTER — Telehealth (INDEPENDENT_AMBULATORY_CARE_PROVIDER_SITE_OTHER): Payer: Self-pay | Admitting: General Surgery

## 2012-03-30 ENCOUNTER — Telehealth (INDEPENDENT_AMBULATORY_CARE_PROVIDER_SITE_OTHER): Payer: Self-pay

## 2012-03-30 NOTE — Telephone Encounter (Signed)
LMOM for pt to call back. Dr Gerrit Friends reviewed request and suggest pt keep appt for 4-17.  Will await pt call back.

## 2012-03-30 NOTE — Telephone Encounter (Signed)
Patient will keep apt on 4-17 with Dr Gerrit Friends

## 2012-03-30 NOTE — Telephone Encounter (Signed)
Message copied by Joanette Gula on Fri Mar 30, 2012  1:34 PM ------      Message from: Velora Heckler      Created: Fri Mar 30, 2012  1:29 PM       Keep appt on 17th.            tmg            Velora Heckler, MD, [facility]      Office: [phone]                  ----- Message -----         From: Joanette Gula, LPN         Sent: 03/28/2012   9:25 AM           To: Velora Heckler, MD                        ----- Message -----         From: Zacarias Pontes         Sent: 03/27/2012   9:59 AM           To: Joanette Gula, LPN            Pt has a reck apt on 4/17 w/TG for thyroid reck and sched sx...she wants to be seen sooner and to have her sx before end of April.She said she would even see another doc if she can have sx by end of April.Marland Kitchen she would like a call back from you please       ------

## 2012-04-19 ENCOUNTER — Encounter (INDEPENDENT_AMBULATORY_CARE_PROVIDER_SITE_OTHER): Payer: PRIVATE HEALTH INSURANCE | Admitting: Surgery

## 2012-09-04 ENCOUNTER — Encounter (INDEPENDENT_AMBULATORY_CARE_PROVIDER_SITE_OTHER): Payer: PRIVATE HEALTH INSURANCE | Admitting: Surgery

## 2012-10-04 ENCOUNTER — Ambulatory Visit (INDEPENDENT_AMBULATORY_CARE_PROVIDER_SITE_OTHER): Payer: BC Managed Care – PPO | Admitting: Surgery

## 2012-10-04 ENCOUNTER — Encounter (INDEPENDENT_AMBULATORY_CARE_PROVIDER_SITE_OTHER): Payer: Self-pay | Admitting: Surgery

## 2012-10-04 VITALS — BP 130/78 | HR 100 | Temp 99.4°F | Resp 14 | Ht 65.0 in | Wt 168.2 lb

## 2012-10-04 DIAGNOSIS — E042 Nontoxic multinodular goiter: Secondary | ICD-10-CM

## 2012-10-04 NOTE — Patient Instructions (Signed)

## 2012-10-04 NOTE — Progress Notes (Signed)
General Surgery [facility]  Chief Complaint  Patient presents with  . Follow-up    multinodular thyroid goiter    HISTORY: The patient is a 52 year old female followed for multinodular thyroid goiter. Patient has a dominant nodule in the left thyroid lobe which has continued to slowly enlarge on sequential ultrasound scanning. On her last ultrasound it measured over 5 cm in greatest dimension. She also has a 1.7 cm nodule in the right thyroid lobe which is remained stable. Patient has never been on thyroid medication. She continues to deny any significant compressive symptoms.  Past Medical History  Diagnosis Date  . Thyroid nodule   . Hyperlipidemia     No current outpatient prescriptions on file.   No current facility-administered medications for this visit.    No Known Allergies  Family History  Problem Relation Age of Onset  . Hypertension Mother   . Hypertension Father   . Diabetes Father   . Myasthenia gravis Father     History   Social History  . Marital Status: Married    Spouse Name: N/A    Number of Children: N/A  . Years of Education: N/A   Social History Main Topics  . Smoking status: Never Smoker   . Smokeless tobacco: None  . Alcohol Use: No  . Drug Use: No  . Sexual Activity: None   Other Topics Concern  . None   Social History Narrative  . None    REVIEW OF SYSTEMS - PERTINENT POSITIVES ONLY: Denies compressive symptoms. Denies tremor. Denies palpitations.  EXAM: Filed Vitals:   10/04/12 1557  BP: 130/78  Pulse: 100  Temp: 99.4 F (37.4 C)  Resp: 14    HEENT: normocephalic; pupils equal and reactive; sclerae clear; dentition good; mucous membranes moist NECK:  Dominant mass occupying the left thyroid lobe extending from mid neck to beneath the left clavicle, slight tracheal deviation to the right; right lobe without palpable abnormality; asymmetric on extension; no palpable anterior or posterior cervical  lymphadenopathy; no supraclavicular masses; no tenderness CHEST: clear to auscultation bilaterally without rales, rhonchi, or wheezes CARDIAC: regular rate and rhythm without significant murmur; peripheral pulses are full EXT:  non-tender without edema; no deformity NEURO: no gross focal deficits; no sign of tremor   LABORATORY RESULTS: See [facility] (CHL-Epic) for most recent results  RADIOLOGY RESULTS: See [facility] (CHL-Epic) for most recent results  IMPRESSION: Multinodular thyroid goiter, dominant left thyroid nodule exceeding 5 cm in dimension, interval enlargement  PLAN: The patient and I again discussed options for management. I offered further evaluation with repeat thyroid ultrasound and TSH level. Patient would like to avoid the additional cost and is interested in proceeding with thyroidectomy. Given the fact that the patient has bilateral thyroid nodules, I have recommended total thyroidectomy. We discussed the procedure, the hospital stay, and the postoperative recovery. We discussed risk and benefits of the procedure including the potential for injury to recurrent laryngeal nerves and parathyroid glands. We discussed the need for lifelong thyroid hormone replacement. Patient understands and wishes to proceed with surgery. We'll make arrangements for her procedure in the near future.  The risks and benefits of the procedure have been discussed at length with the patient.  The patient understands the proposed procedure, potential alternative treatments, and the course of recovery to be expected.  All of the patient's questions have been answered at this time.  The patient wishes to proceed with surgery.  Velora Heckler, MD, FACS  General & Endocrine Surgery [facility]  Primary Care Physician: Garlan Fillers, MD

## 2012-10-24 IMAGING — US US SOFT TISSUE HEAD/NECK
1 series · 13 of 25 positions shown · non-contrast
Comparison: Multiple prior studies.

CLINICAL DATA: History multinodular thyroid goiter with prior
bilateral thyroid biopsy.

THYROID ULTRASOUND
TECHNIQUE: Ultrasound examination of the thyroid gland and adjacent
soft tissues was performed.

[Series 1: us soft tissue head/neck · 0.07mm/px · 13 of 58 slices shown]
[im 1/58]
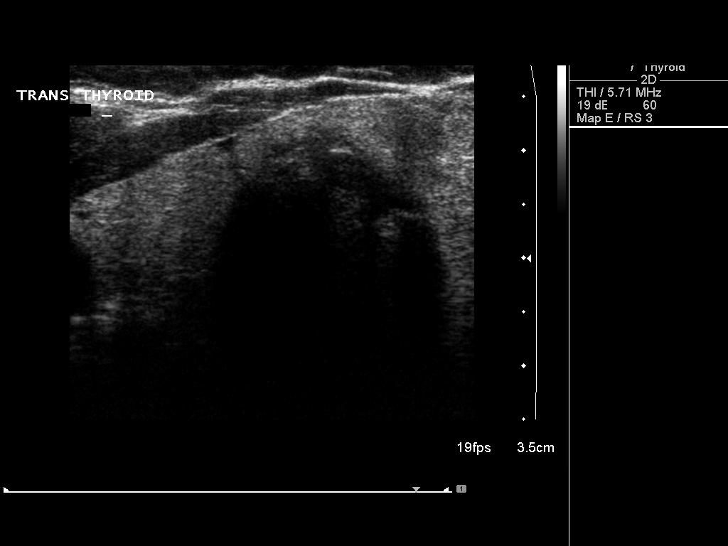
[im 5/58]
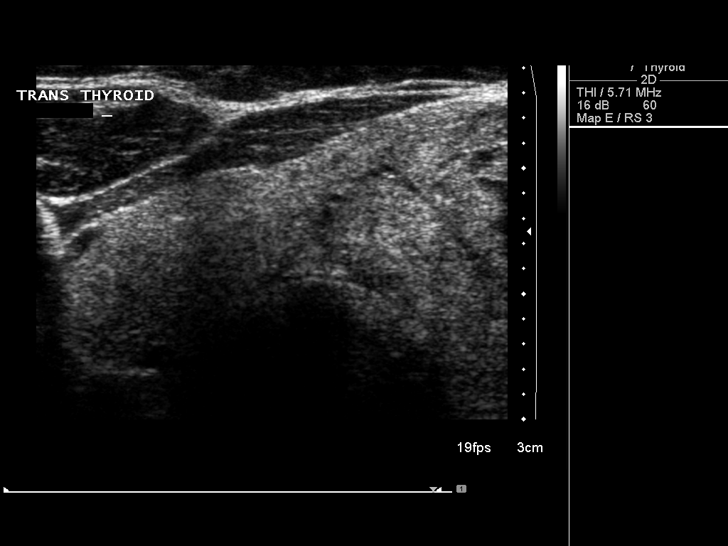
[im 10/58]
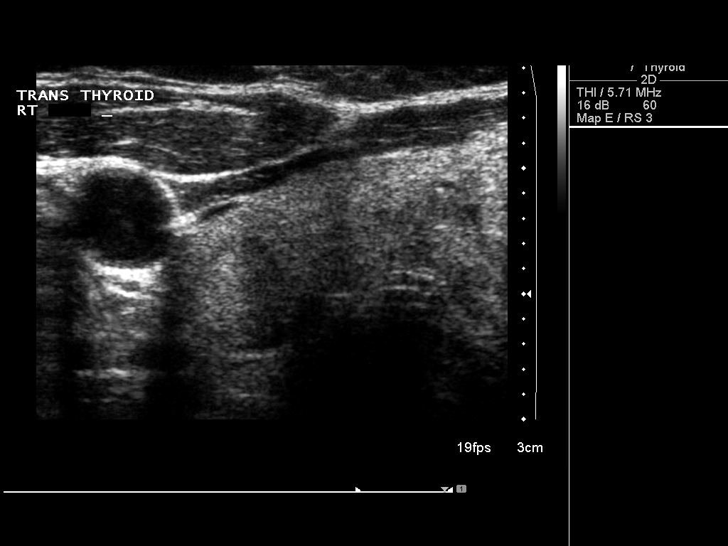
[im 15/58]
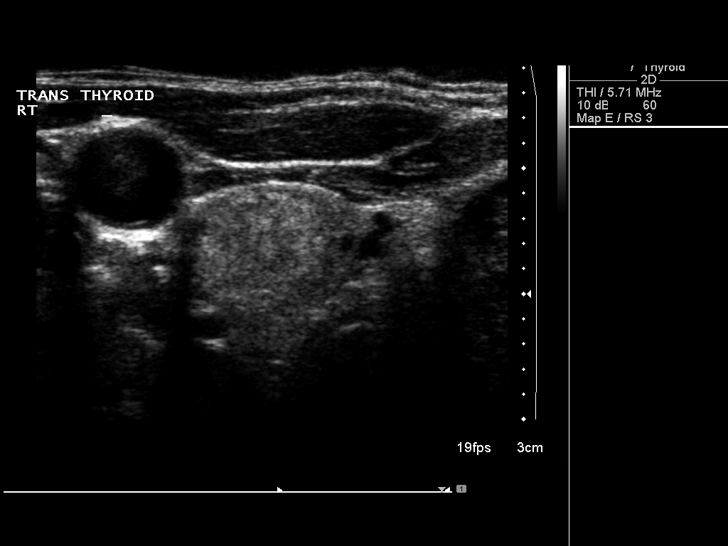
[im 20/58]
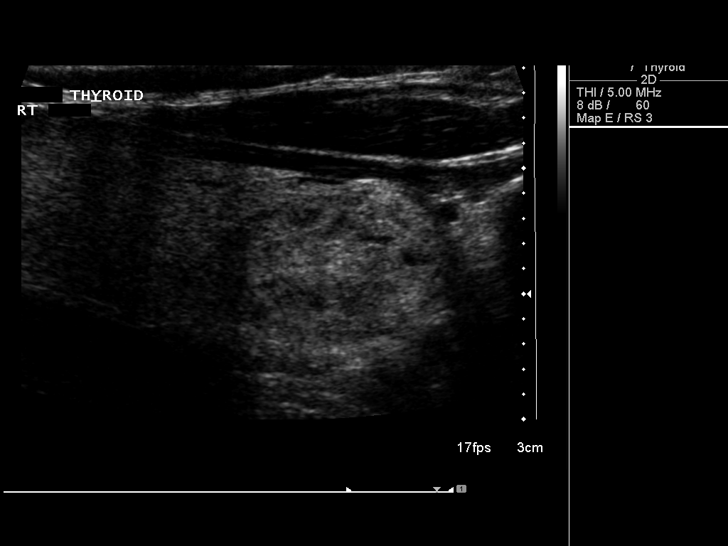
[im 24/58]
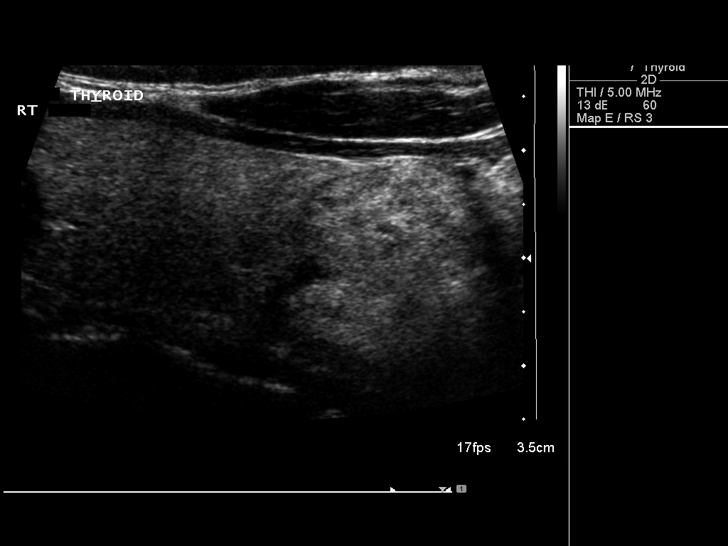
[im 29/58]
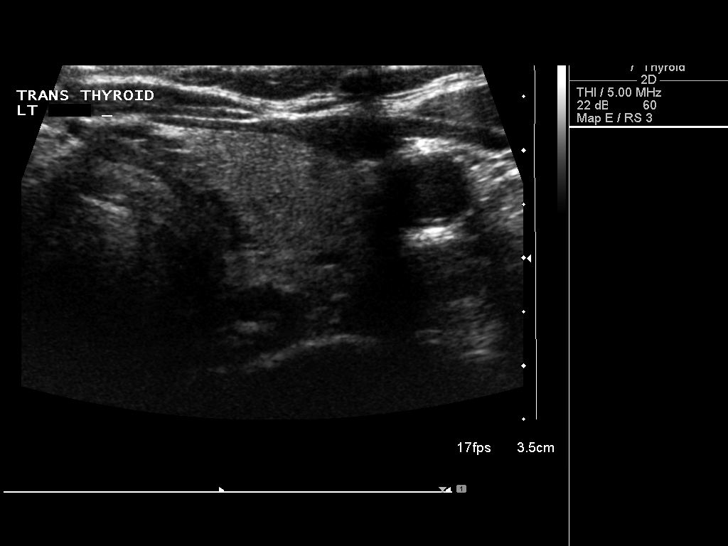
[im 34/58]
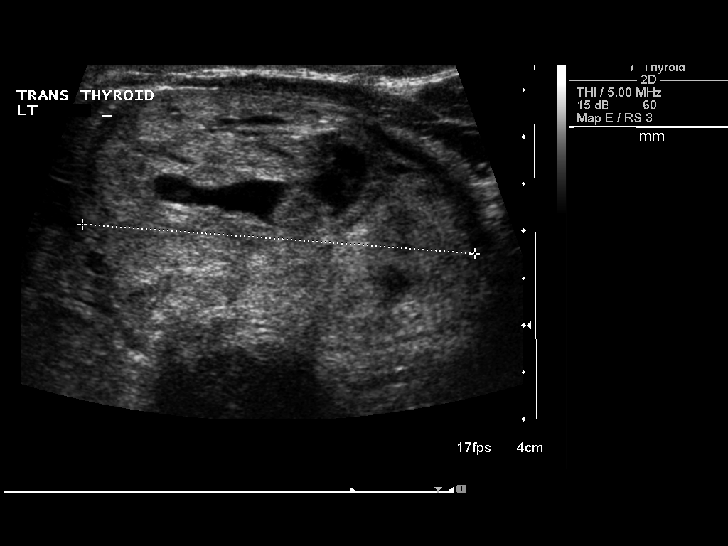
[im 39/58]
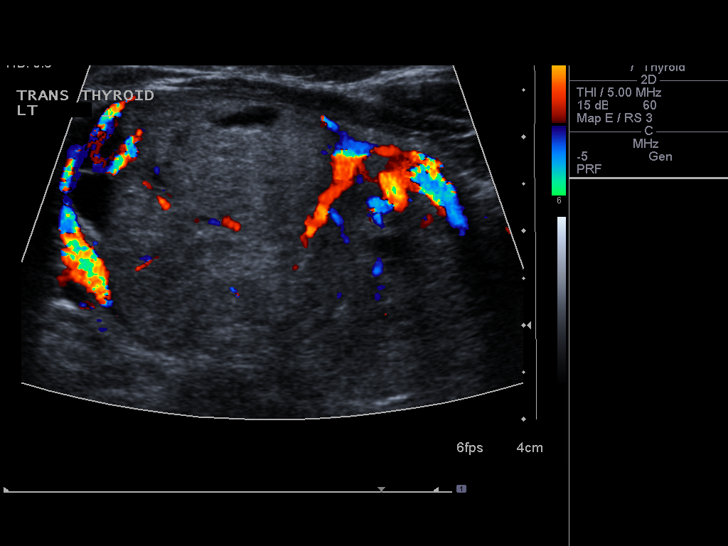
[im 43/58]
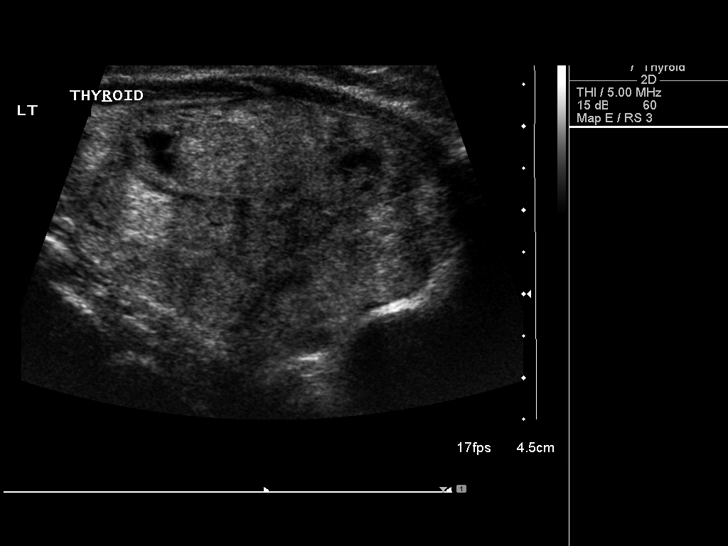
[im 48/58]
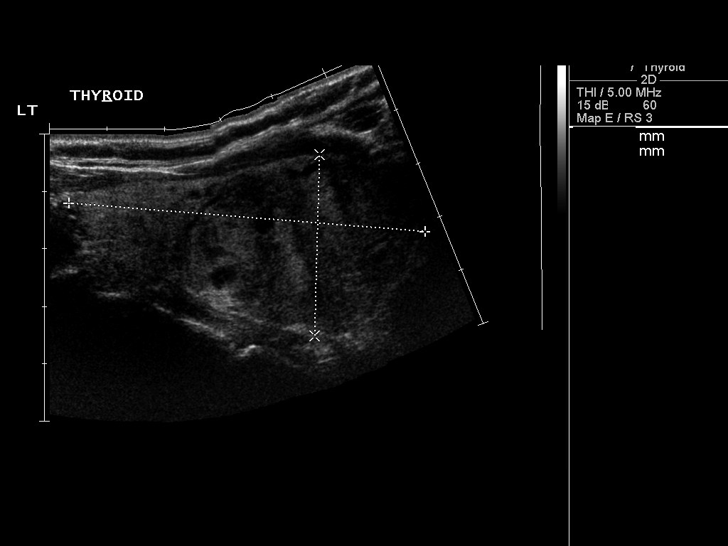
[im 53/58]
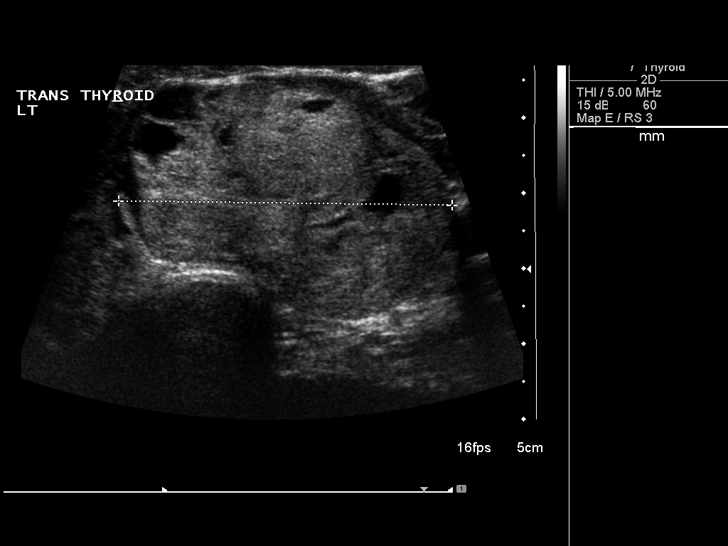
[im 58/58]
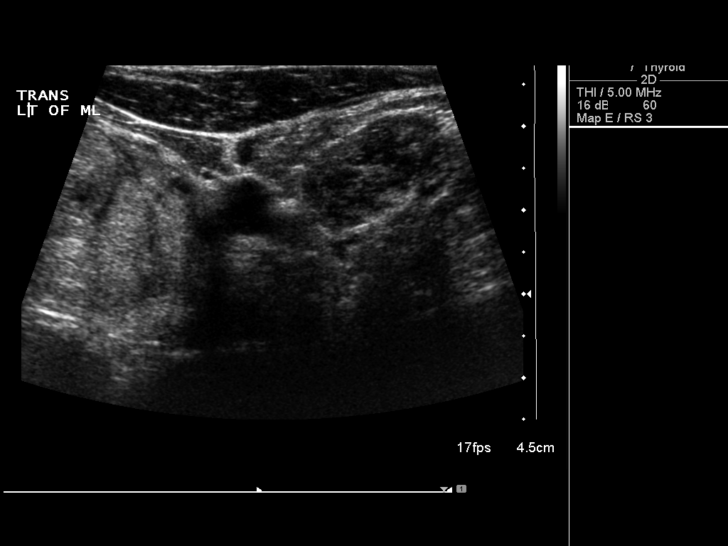

[13 of 25 positions shown; findings below may reference images not displayed]

The latest diagnostic
ultrasound is dated 04/09/2010.  Other biopsy and diagnostic
studies are available dating back to 6559.
FINDINGS: Right thyroid lobe:  4.8 x 1.8 x 1.7 cm.
Left thyroid lobe:  6.2 x 3.2 x 4.4 cm.
Isthmus:  0.8 cm.

Focal nodules:  Stable predominately solid rounded nodule in the
inferior right lobe measures approximately 1.5 x 1.2 x 1.3 cm.
This nodule does not contain calcifications and shows stable
increased peripheral vascularity.  Dominant lobulated mass in the
left lobe shows stable morphology with estimated dimensions of
x 2.8 x 4.2 cm.  This nodule is again noted to be essentially a
conglomeration of multiple nodular areas containing both solid and
cystic tissue and no gross calcifications.  This is essentially
stable but to the nodule may be slightly thinner in one dimension
both subjectively and by measurement.

Lymphadenopathy:  None visualized.
IMPRESSION: Stable thyroid goiter with bilateral nodules.

## 2012-10-30 ENCOUNTER — Encounter (HOSPITAL_COMMUNITY): Payer: Self-pay | Admitting: Pharmacy Technician

## 2012-10-31 ENCOUNTER — Other Ambulatory Visit (HOSPITAL_COMMUNITY): Payer: Self-pay | Admitting: Surgery

## 2012-10-31 NOTE — Patient Instructions (Addendum)
20 Stacie [name]  10/31/2012   Your procedure is scheduled on: 11-08-2012  Report to [facility] at 700 AM.  Call this number if you have problems the morning of surgery [phone]   Remember:   Do not eat food or drink liquids :After Midnight.     Take these medicines the morning of surgery with A SIP OF WATER: pravastatin                                SEE [facility] PREPARING FOR SURGERY SHEET             You may not have any metal on your body including hair pins and piercings  Do not wear jewelry, make-up.  Do not wear lotions, powders, or perfumes. You may wear deodorant.   Men may shave face and neck.  Do not bring valuables to the hospital. [facility] IS NOT RESPONSIBLE FOR VALUEABLES.  Contacts, dentures or bridgework may not be worn into surgery.  Leave suitcase in the car. After surgery it may be brought to your room.  For patients admitted to the hospital, checkout time is 11:00 AM the day of discharge.   Patients discharged the day of surgery will not be allowed to drive home.  Name and phone number of your driver:  Special Instructions: N/A   Please read over the following fact sheets that you were given:   Call Cain Sieve RN pre op nurse if needed [phone]    FAILURE TO FOLLOW THESE INSTRUCTIONS MAY RESULT IN THE CANCELLATION OF YOUR SURGERY.  PATIENT SIGNATURE___________________________________________  NURSE SIGNATURE_____________________________________________

## 2012-11-01 ENCOUNTER — Encounter (HOSPITAL_COMMUNITY): Payer: Self-pay

## 2012-11-01 ENCOUNTER — Encounter (HOSPITAL_COMMUNITY)
Admission: RE | Admit: 2012-11-01 | Discharge: 2012-11-01 | Disposition: A | Discharge status: Home / Self Care | Payer: BC Managed Care – PPO | Source: Ambulatory Visit | Attending: Surgery | Admitting: Surgery

## 2012-11-01 ENCOUNTER — Ambulatory Visit (HOSPITAL_COMMUNITY)
Admission: RE | Admit: 2012-11-01 | Discharge: 2012-11-01 | Disposition: A | Discharge status: Home / Self Care | Payer: BC Managed Care – PPO | Source: Ambulatory Visit | Attending: Surgery | Admitting: Surgery

## 2012-11-01 DIAGNOSIS — Z01818 Encounter for other preprocedural examination: Secondary | ICD-10-CM | POA: Insufficient documentation

## 2012-11-01 DIAGNOSIS — E042 Nontoxic multinodular goiter: Secondary | ICD-10-CM | POA: Insufficient documentation

## 2012-11-01 DIAGNOSIS — Z01812 Encounter for preprocedural laboratory examination: Secondary | ICD-10-CM | POA: Insufficient documentation

## 2012-11-01 HISTORY — DX: Other specified postprocedural states: R11.2

## 2012-11-01 HISTORY — DX: Other specified postprocedural states: Z98.890

## 2012-11-01 LAB — CBC
Hemoglobin: 12.3 g/dL (ref 12.0–15.0)
MCH: 30.8 pg (ref 26.0–34.0)
MCHC: 34.5 g/dL (ref 30.0–36.0)
MCV: 89.5 fL (ref 78.0–100.0)
Platelets: 466 10*3/uL — ABNORMAL HIGH (ref 150–400)

## 2012-11-07 NOTE — Progress Notes (Signed)
Quick Note:  These results are acceptable for scheduled surgery.  Alanson Hausmann M. Kasper Mudrick, MD, FACS [facility] Office: [phone]   ______ 

## 2012-11-08 ENCOUNTER — Encounter (HOSPITAL_COMMUNITY): Payer: BC Managed Care – PPO | Admitting: Anesthesiology

## 2012-11-08 ENCOUNTER — Encounter (HOSPITAL_COMMUNITY): Payer: Self-pay | Admitting: *Deleted

## 2012-11-08 ENCOUNTER — Ambulatory Visit (HOSPITAL_COMMUNITY): Payer: BC Managed Care – PPO | Admitting: Anesthesiology

## 2012-11-08 ENCOUNTER — Observation Stay (HOSPITAL_COMMUNITY)
Admission: RE | Admit: 2012-11-08 | Discharge: 2012-11-09 | Disposition: A | Discharge status: Home / Self Care | Payer: BC Managed Care – PPO | Source: Ambulatory Visit | Attending: Surgery | Admitting: Surgery

## 2012-11-08 ENCOUNTER — Encounter (HOSPITAL_COMMUNITY)
Admission: RE | Disposition: A | Discharge status: Home / Self Care | Payer: Self-pay | Source: Ambulatory Visit | Attending: Surgery

## 2012-11-08 DIAGNOSIS — D34 Benign neoplasm of thyroid gland: Secondary | ICD-10-CM

## 2012-11-08 DIAGNOSIS — E042 Nontoxic multinodular goiter: Principal | ICD-10-CM | POA: Diagnosis present

## 2012-11-08 DIAGNOSIS — Z79899 Other long term (current) drug therapy: Secondary | ICD-10-CM | POA: Insufficient documentation

## 2012-11-08 DIAGNOSIS — E785 Hyperlipidemia, unspecified: Secondary | ICD-10-CM | POA: Insufficient documentation

## 2012-11-08 DIAGNOSIS — Z7982 Long term (current) use of aspirin: Secondary | ICD-10-CM | POA: Insufficient documentation

## 2012-11-08 HISTORY — PX: THYROIDECTOMY: SHX17

## 2012-11-08 SURGERY — THYROIDECTOMY
Anesthesia: General | Site: Neck | Wound class: Clean

## 2012-11-08 MED ORDER — ACETAMINOPHEN 325 MG PO TABS: 650.0000 mg | ORAL_TABLET | ORAL | Status: DC | PRN

## 2012-11-08 MED ORDER — HYDROMORPHONE HCL PF 1 MG/ML IJ SOLN
0.2500 mg | INTRAMUSCULAR | Status: DC | PRN
  Administered 2012-11-08 (×2): 0.5 mg via INTRAVENOUS

## 2012-11-08 MED ORDER — ONDANSETRON HCL 4 MG/2ML IJ SOLN
4.0000 mg | Freq: Four times a day (QID) | INTRAMUSCULAR | Status: DC | PRN
  Administered 2012-11-08: 4 mg via INTRAVENOUS
  Filled 2012-11-08: qty 2

## 2012-11-08 MED ORDER — FENTANYL CITRATE 0.05 MG/ML IJ SOLN
INTRAMUSCULAR | Status: DC | PRN
  Administered 2012-11-08 (×4): 50 ug via INTRAVENOUS
  Administered 2012-11-08: 100 ug via INTRAVENOUS
  Administered 2012-11-08: 50 ug via INTRAVENOUS
  Administered 2012-11-08: 100 ug via INTRAVENOUS
  Administered 2012-11-08: 50 ug via INTRAVENOUS

## 2012-11-08 MED ORDER — LABETALOL HCL 5 MG/ML IV SOLN
INTRAVENOUS | Status: DC | PRN
  Administered 2012-11-08: 2.5 mg via INTRAVENOUS
  Administered 2012-11-08: 5 mg via INTRAVENOUS
  Administered 2012-11-08: 2.5 mg via INTRAVENOUS
  Administered 2012-11-08: 5 mg via INTRAVENOUS

## 2012-11-08 MED ORDER — 0.9 % SODIUM CHLORIDE (POUR BTL) OPTIME
TOPICAL | Status: DC | PRN
  Administered 2012-11-08: 1000 mL

## 2012-11-08 MED ORDER — ROCURONIUM BROMIDE 100 MG/10ML IV SOLN
INTRAVENOUS | Status: DC | PRN
  Administered 2012-11-08: 50 mg via INTRAVENOUS
  Administered 2012-11-08: 10 mg via INTRAVENOUS

## 2012-11-08 MED ORDER — HYDROMORPHONE HCL PF 1 MG/ML IJ SOLN: 1.0000 mg | INTRAMUSCULAR | Status: DC | PRN

## 2012-11-08 MED ORDER — METOCLOPRAMIDE HCL 5 MG/ML IJ SOLN
INTRAMUSCULAR | Status: DC | PRN
  Administered 2012-11-08: 10 mg via INTRAVENOUS

## 2012-11-08 MED ORDER — LACTATED RINGERS IV SOLN: INTRAVENOUS | Status: DC

## 2012-11-08 MED ORDER — HYDROCODONE-ACETAMINOPHEN 5-325 MG PO TABS
1.0000 | ORAL_TABLET | ORAL | Status: DC | PRN
  Administered 2012-11-08: 1 via ORAL
  Administered 2012-11-08: 2 via ORAL
  Filled 2012-11-08: qty 2
  Filled 2012-11-08: qty 1

## 2012-11-08 MED ORDER — HYDRALAZINE HCL 20 MG/ML IJ SOLN
INTRAMUSCULAR | Status: DC | PRN
  Administered 2012-11-08 (×3): 5 mg via INTRAVENOUS

## 2012-11-08 MED ORDER — CEFAZOLIN SODIUM-DEXTROSE 2-3 GM-% IV SOLR
2.0000 g | INTRAVENOUS | Status: AC
  Administered 2012-11-08: 2 g via INTRAVENOUS

## 2012-11-08 MED ORDER — NEOSTIGMINE METHYLSULFATE 1 MG/ML IJ SOLN
INTRAMUSCULAR | Status: DC | PRN
  Administered 2012-11-08: 4 mg via INTRAVENOUS

## 2012-11-08 MED ORDER — CEFAZOLIN SODIUM-DEXTROSE 2-3 GM-% IV SOLR
INTRAVENOUS | Status: AC
  Filled 2012-11-08: qty 50

## 2012-11-08 MED ORDER — KCL IN DEXTROSE-NACL 20-5-0.45 MEQ/L-%-% IV SOLN
INTRAVENOUS | Status: DC
  Administered 2012-11-08: 15:00:00 via INTRAVENOUS
  Filled 2012-11-08 (×2): qty 1000

## 2012-11-08 MED ORDER — PROPOFOL 10 MG/ML IV BOLUS
INTRAVENOUS | Status: DC | PRN
  Administered 2012-11-08: 200 mg via INTRAVENOUS

## 2012-11-08 MED ORDER — LIDOCAINE HCL (CARDIAC) 20 MG/ML IV SOLN
INTRAVENOUS | Status: DC | PRN
  Administered 2012-11-08: 50 mg via INTRAVENOUS

## 2012-11-08 MED ORDER — HYDROMORPHONE HCL PF 1 MG/ML IJ SOLN
INTRAMUSCULAR | Status: AC
  Filled 2012-11-08: qty 1

## 2012-11-08 MED ORDER — CALCIUM CARBONATE 1250 (500 CA) MG PO TABS
2.0000 | ORAL_TABLET | Freq: Three times a day (TID) | ORAL | Status: DC
  Administered 2012-11-08 – 2012-11-09 (×2): 1000 mg via ORAL
  Filled 2012-11-08 (×5): qty 2

## 2012-11-08 MED ORDER — DEXAMETHASONE SODIUM PHOSPHATE 10 MG/ML IJ SOLN
INTRAMUSCULAR | Status: DC | PRN
  Administered 2012-11-08: 5 mg via INTRAVENOUS

## 2012-11-08 MED ORDER — LACTATED RINGERS IV SOLN
INTRAVENOUS | Status: DC | PRN
  Administered 2012-11-08 (×2): via INTRAVENOUS

## 2012-11-08 MED ORDER — GLYCOPYRROLATE 0.2 MG/ML IJ SOLN
INTRAMUSCULAR | Status: DC | PRN
  Administered 2012-11-08: .6 mg via INTRAVENOUS

## 2012-11-08 MED ORDER — ONDANSETRON HCL 4 MG/2ML IJ SOLN
INTRAMUSCULAR | Status: DC | PRN
  Administered 2012-11-08: 4 mg via INTRAVENOUS

## 2012-11-08 MED ORDER — MIDAZOLAM HCL 5 MG/5ML IJ SOLN
INTRAMUSCULAR | Status: DC | PRN
  Administered 2012-11-08: 2 mg via INTRAVENOUS

## 2012-11-08 MED ORDER — ONDANSETRON HCL 4 MG PO TABS: 4.0000 mg | ORAL_TABLET | Freq: Four times a day (QID) | ORAL | Status: DC | PRN

## 2012-11-08 SURGICAL SUPPLY — 37 items
ATTRACTOMAT 16X20 MAGNETIC DRP (DRAPES) ×2 IMPLANT
BENZOIN TINCTURE PRP APPL 2/3 (GAUZE/BANDAGES/DRESSINGS) ×2 IMPLANT
BLADE HEX COATED 2.75 (ELECTRODE) ×2 IMPLANT
BLADE SURG 15 STRL LF DISP TIS (BLADE) ×1 IMPLANT
BLADE SURG 15 STRL SS (BLADE) ×1
CANISTER SUCTION 2500CC (MISCELLANEOUS) ×2 IMPLANT
CHLORAPREP W/TINT 26ML (MISCELLANEOUS) ×2 IMPLANT
CLIP TI MEDIUM 6 (CLIP) ×8 IMPLANT
CLIP TI WIDE RED SMALL 6 (CLIP) ×6 IMPLANT
CLOTH BEACON ORANGE TIMEOUT ST (SAFETY) IMPLANT
DISSECTOR ROUND CHERRY 3/8 STR (MISCELLANEOUS) IMPLANT
DRAPE PED LAPAROTOMY (DRAPES) ×2 IMPLANT
DRESSING SURGICEL FIBRLLR 1X2 (HEMOSTASIS) ×1 IMPLANT
DRSG SURGICEL FIBRILLAR 1X2 (HEMOSTASIS) ×2
ELECT REM PT RETURN 9FT ADLT (ELECTROSURGICAL) ×2
ELECTRODE REM PT RTRN 9FT ADLT (ELECTROSURGICAL) ×1 IMPLANT
GAUZE SPONGE 4X4 16PLY XRAY LF (GAUZE/BANDAGES/DRESSINGS) ×4 IMPLANT
GLOVE SURG ORTHO 8.0 STRL STRW (GLOVE) ×2 IMPLANT
GOWN STRL REIN XL XLG (GOWN DISPOSABLE) ×6 IMPLANT
KIT BASIN OR (CUSTOM PROCEDURE TRAY) ×2 IMPLANT
NS IRRIG 1000ML POUR BTL (IV SOLUTION) ×2 IMPLANT
PACK BASIC VI WITH GOWN DISP (CUSTOM PROCEDURE TRAY) ×2 IMPLANT
PENCIL BUTTON HOLSTER BLD 10FT (ELECTRODE) ×2 IMPLANT
SHEARS HARMONIC 9CM CVD (BLADE) ×2 IMPLANT
SPONGE GAUZE 4X4 12PLY (GAUZE/BANDAGES/DRESSINGS) IMPLANT
STAPLER VISISTAT 35W (STAPLE) IMPLANT
STRIP CLOSURE SKIN 1/2X4 (GAUZE/BANDAGES/DRESSINGS) ×2 IMPLANT
SUT MNCRL AB 4-0 PS2 18 (SUTURE) ×2 IMPLANT
SUT SILK 2 0 (SUTURE)
SUT SILK 2-0 18XBRD TIE 12 (SUTURE) IMPLANT
SUT SILK 3 0 (SUTURE)
SUT SILK 3-0 18XBRD TIE 12 (SUTURE) IMPLANT
SUT VIC AB 3-0 SH 18 (SUTURE) ×4 IMPLANT
SYR BULB IRRIGATION 50ML (SYRINGE) ×2 IMPLANT
TOWEL OR 17X26 10 PK STRL BLUE (TOWEL DISPOSABLE) ×2 IMPLANT
TOWEL OR NON WOVEN STRL DISP B (DISPOSABLE) ×2 IMPLANT
YANKAUER SUCT BULB TIP 10FT TU (MISCELLANEOUS) ×2 IMPLANT

## 2012-11-08 NOTE — Progress Notes (Signed)
Report received from Pilar Plate RN, pt dressing clean dry and intact, with ice pack in place, air way clear, voice without hoarseness. Denies tingling of fingers or around mouth.  Patient ambulated to restroom and voiding without difficulty

## 2012-11-08 NOTE — Transfer of Care (Signed)
Immediate Anesthesia Transfer of Care Note  Patient: Stacie [name]  Procedure(s) Performed: Procedure(s):  TOTAL THYROIDECTOMY (N/A)  Patient Location: PACU  Anesthesia Type:General  Level of Consciousness: awake, alert , oriented and patient cooperative  Airway & Oxygen Therapy: Patient Spontanous Breathing and Patient connected to face mask oxygen  Post-op Assessment: Report given to PACU RN, Post -op Vital signs reviewed and stable and Patient moving all extremities  Post vital signs: Reviewed and stable  Complications: No apparent anesthesia complications

## 2012-11-08 NOTE — H&P (Signed)
Stacie [name] is an 52 y.o. female.    General Surgery [facility]  Chief Complaint: multinodular goiter with compressive symptoms  HPI: The patient is a 51 year old female followed for multinodular thyroid goiter. Patient has a dominant nodule in the left thyroid lobe which has continued to slowly enlarge on sequential ultrasound scanning. On her last ultrasound it measured over 5 cm in greatest dimension. She also has a 1.7 cm nodule in the right thyroid lobe which is remained stable. Patient has never been on thyroid medication.   Past Medical History  Diagnosis Date  . Thyroid nodule   . Hyperlipidemia   . PONV (postoperative nausea and vomiting)     nausea only    Past Surgical History  Procedure Laterality Date  . Appendectomy  age 48  . Abdominal hysterectomy  yrs ago    Family History  Problem Relation Age of Onset  . Hypertension Mother   . Hypertension Father   . Diabetes Father   . Myasthenia gravis Father    Social History:  reports that she has never smoked. She has never used smokeless tobacco. She reports that she does not drink alcohol or use illicit drugs.  Allergies: No Known Allergies  Medications Prior to Admission  Medication Sig Dispense Refill  . aspirin EC 81 MG tablet Take 81 mg by mouth daily.      . diphenhydrAMINE (SOMINEX) 25 MG tablet Take 25 mg by mouth at bedtime as needed for sleep.      . pravastatin (PRAVACHOL) 20 MG tablet Take 20 mg by mouth every morning.        No results found for this or any previous visit (from the past 48 hour(s)). No results found.  Review of Systems  Constitutional: Negative.   HENT:       Mild pressure/compression from enlarged thyroid  Eyes: Negative.   Respiratory: Negative.   Cardiovascular: Negative.   Gastrointestinal: Negative.   Genitourinary: Negative.   Musculoskeletal: Negative.   Skin: Negative.   Neurological: Negative.   Endo/Heme/Allergies: Negative.    Psychiatric/Behavioral: Negative.     Blood pressure 127/88, pulse 85, temperature 97.7 F (36.5 C), temperature source Oral, resp. rate 18, SpO2 98.00%. Physical Exam  Constitutional: She is oriented to person, place, and time. She appears well-developed and well-nourished. No distress.  HENT:  Head: Normocephalic and atraumatic.  Right Ear: External ear normal.  Left Ear: External ear normal.  Eyes: Conjunctivae are normal. Pupils are equal, round, and reactive to light. No scleral icterus.  Neck: Normal range of motion. Neck supple. Tracheal deviation present. Thyromegaly present.  Cardiovascular: Normal rate, regular rhythm and normal heart sounds.   No murmur heard. Respiratory: Effort normal and breath sounds normal. She has no wheezes.  GI: Soft. Bowel sounds are normal. She exhibits no distension.  Musculoskeletal: Normal range of motion. She exhibits no edema.  Lymphadenopathy:    She has no cervical adenopathy.  Neurological: She is alert and oriented to person, place, and time.  Skin: Skin is warm and dry.  Psychiatric: She has a normal mood and affect. Her behavior is normal.     Assessment/Plan Multinodular goiter with mild compressive symptoms  Plan total thyroidectomy  The risks and benefits of the procedure have been discussed at length with the patient.  The patient understands the proposed procedure, potential alternative treatments, and the course of recovery to be expected.  All of the patient's questions have been answered at this time.  The patient wishes to proceed with surgery.  Velora Heckler, MD, [facility] Office: [phone]    Donterius Filley M 11/08/2012, 9:34 AM

## 2012-11-08 NOTE — Addendum Note (Signed)
Addendum created 11/08/12 1340 by Garth Bigness, CRNA   Modules edited: Anesthesia Flowsheet

## 2012-11-08 NOTE — Anesthesia Postprocedure Evaluation (Signed)
  Anesthesia Post-op Note  Patient: Stacie [name]  Procedure(s) Performed: Procedure(s) (LRB):  TOTAL THYROIDECTOMY (N/A)  Patient Location: PACU  Anesthesia Type: General  Level of Consciousness: awake and alert   Airway and Oxygen Therapy: Patient Spontanous Breathing  Post-op Pain: mild  Post-op Assessment: Post-op Vital signs reviewed, Patient's Cardiovascular Status Stable, Respiratory Function Stable, Patent Airway and No signs of Nausea or vomiting  Last Vitals:  Filed Vitals:   11/08/12 1145  BP: 140/80  Pulse: 89  Temp:   Resp: 23    Post-op Vital Signs: stable   Complications: No apparent anesthesia complications

## 2012-11-08 NOTE — Brief Op Note (Signed)
11/08/2012  11:38 AM  PATIENT:  Stacie [name]  52 y.o. female  PRE-OPERATIVE DIAGNOSIS:  multinodular thyroid goiter  POST-OPERATIVE DIAGNOSIS:  multinodular thyroid goiter  PROCEDURE:  Procedure(s):  TOTAL THYROIDECTOMY (N/A)  SURGEON:  Surgeon(s) and Role:    * Velora Heckler, MD - Primary  ANESTHESIA:   general  EBL:  Total I/O In: 1000 [I.V.:1000] Out: 50 [Blood:50]  BLOOD ADMINISTERED:none  DRAINS: none   LOCAL MEDICATIONS USED:  none  SPECIMEN:  Excision  DISPOSITION OF SPECIMEN:  PATHOLOGY  COUNTS:  YES  TOURNIQUET:  * No tourniquets in log *  DICTATION: .Other Dictation: Dictation Number [id number]  PLAN OF CARE: Admit for overnight observation  PATIENT DISPOSITION:  PACU - hemodynamically stable.   Delay start of Pharmacological VTE agent (>24hrs) due to surgical blood loss or risk of bleeding: yes  Velora Heckler, MD, [facility] Office: [phone]

## 2012-11-08 NOTE — Anesthesia Preprocedure Evaluation (Addendum)
Anesthesia Evaluation  Patient identified by MRN, date of birth, ID band Patient awake    Reviewed: Allergy & Precautions, H&P , NPO status , Patient's Chart, lab work & pertinent test results  History of Anesthesia Complications (+) PONV  Airway Mallampati: II TM Distance: >3 FB Neck ROM: full    Dental no notable dental hx. (+) Teeth Intact and Dental Advisory Given   Pulmonary neg pulmonary ROS,  breath sounds clear to auscultation  Pulmonary exam normal       Cardiovascular Exercise Tolerance: Good negative cardio ROS  Rhythm:regular Rate:Normal     Neuro/Psych negative neurological ROS  negative psych ROS   GI/Hepatic negative GI ROS, Neg liver ROS,   Endo/Other  negative endocrine ROSMultinodular goiter  Renal/GU negative Renal ROS  negative genitourinary   Musculoskeletal   Abdominal   Peds  Hematology negative hematology ROS (+)   Anesthesia Other Findings   Reproductive/Obstetrics negative OB ROS                         Anesthesia Physical Anesthesia Plan  ASA: II  Anesthesia Plan: General   Post-op Pain Management:    Induction: Intravenous  Airway Management Planned: Oral ETT  Additional Equipment:   Intra-op Plan:   Post-operative Plan: Extubation in OR  Informed Consent: I have reviewed the patients History and Physical, chart, labs and discussed the procedure including the risks, benefits and alternatives for the proposed anesthesia with the patient or authorized representative who has indicated his/her understanding and acceptance.   Dental Advisory Given  Plan Discussed with: CRNA and Surgeon  Anesthesia Plan Comments:         Anesthesia Quick Evaluation

## 2012-11-09 ENCOUNTER — Other Ambulatory Visit (INDEPENDENT_AMBULATORY_CARE_PROVIDER_SITE_OTHER): Payer: Self-pay

## 2012-11-09 ENCOUNTER — Telehealth (INDEPENDENT_AMBULATORY_CARE_PROVIDER_SITE_OTHER): Payer: Self-pay

## 2012-11-09 ENCOUNTER — Encounter (HOSPITAL_COMMUNITY): Payer: Self-pay | Admitting: Surgery

## 2012-11-09 LAB — BASIC METABOLIC PANEL
BUN: 9 mg/dL (ref 6–23)
Calcium: 9.6 mg/dL (ref 8.4–10.5)
Chloride: 99 mEq/L (ref 96–112)
Creatinine, Ser: 0.79 mg/dL (ref 0.50–1.10)
GFR calc Af Amer: >90 mL/min (ref >90)
GFR calc non Af Amer: >90 mL/min (ref >90)
Potassium: 3.5 mEq/L (ref 3.5–5.1)
Sodium: 135 mEq/L (ref 135–145)

## 2012-11-09 MED ORDER — OXYCODONE-ACETAMINOPHEN 5-325 MG PO TABS: 1.0000 | ORAL_TABLET | ORAL | Status: DC | PRN

## 2012-11-09 MED ORDER — SYNTHROID 100 MCG PO TABS: 100.0000 ug | ORAL_TABLET | Freq: Every day | ORAL | Status: DC

## 2012-11-09 MED ORDER — CALCIUM CARBONATE 1250 (500 CA) MG PO TABS: 2.0000 | ORAL_TABLET | Freq: Two times a day (BID) | ORAL | Status: DC

## 2012-11-09 NOTE — Op Note (Signed)
Stacie [name], Stacie [name] NO.:  [id number]  MEDICAL RECORD NO.:  [id number]  LOCATION:  1524                         FACILITY:  [facility]  PHYSICIAN:  Velora Heckler, MD      DATE OF BIRTH:  07/23/1960  DATE OF PROCEDURE:  11/08/2012                               OPERATIVE REPORT   PREOPERATIVE DIAGNOSIS:  Multinodular goiter with compressive symptoms.  POSTOPERATIVE DIAGNOSIS:  Multinodular goiter with compressive symptoms.  PROCEDURE:  Total thyroidectomy.  SURGEON:  Velora Heckler, MD, FACS  ANESTHESIA:  General.  ESTIMATED BLOOD LOSS:  Minimal.  PREPARATION:  ChloraPrep.  COMPLICATIONS:  None.  INDICATIONS:  The patient is a 52 year old female, followed for multinodular thyroid goiter.  Dominant nodule in the left thyroid lobe is slowly enlarged on sequential ultrasound scanning.  It now measures greater than 5 cm in size.  Smaller nodules were present in the right thyroid lobe.  The patient has never been on thyroid medication.  She has developed mild compressive symptoms.  She now comes to Surgery for thyroidectomy.  BODY OF REPORT:  Procedures done in OR #1 at the [facility].  The patient was brought to the operating room, placed in the supine position on the operating room table.  Following administration of general anesthesia, the patient was positioned and then prepped and draped in the usual aseptic fashion.  After ascertaining that an adequate level of anesthesia had been achieved, a Kocher incision was made with a #15 blade.  Dissection was carried through subcutaneous tissues and platysma.  Hemostasis was achieved with the electrocautery. Skin flaps were elevated cephalad and caudad from the thyroid notch to the sternal notch.  The Mahorner self-retaining retractor was placed for exposure.  Strap muscles were incised in the midline.  Dissection was begun on the left.  Left thyroid lobe was markedly enlarged.  It displaces the  trachea posteriorly and to the right.  Strap muscles were reflected laterally.  Left lobe is gently mobilized.  Superior pole vessels were dissected out and divided between medium Ligaclips with the Harmonic scalpel.  Inferior venous tributaries were dissected out and divided between Ligaclips with the Harmonic scalpel.  Parathyroid tissue was identified and preserved on its vascular pedicle.  Gland was rolled anteriorly.  Branches of the inferior thyroid artery were divided between small Ligaclips with the Harmonic scalpel.  Gland was rolled further anteriorly and the ligament of Allyson Sabal was released with the electrocautery.  Gland was mobilized up and onto the anterior trachea. There was no significant pyramidal lobe present.  Next, we turned our attention to the right thyroid lobe.  Right thyroid lobe was moderately smaller.  It is gently mobilized with blunt dissection.  Venous tributaries were divided between Ligaclips. Superior pole was dissected out.  Superior pole vessels divided individually between medium Ligaclips with the Harmonic scalpel. Parathyroid tissue was again identified and preserved.  Inferior venous tributaries were divided between Ligaclips with the Harmonic scalpel. Gland was rolled anteriorly.  Ligament of Allyson Sabal was released with the electrocautery.  Recurrent nerve was identified and preserved.  Inferior parathyroid gland was identified and preserved.  Gland was mobilized onto the anterior trachea  from which it was completely excised with the Harmonic scalpel.  Suture was used to mark the left superior pole.  The entire thyroid gland was submitted to Pathology for review.  Neck was irrigated both bilaterally with warm saline.  Good hemostasis was noted throughout.  Fibrillar was placed throughout the operative field.  Strap muscles were reapproximated in the midline with interrupted #3-0 Vicryl sutures.  Platysma was closed with interrupted #3-0 Vicryl sutures.   Skin was closed with a running #4-0 Monocryl subcuticular suture.  Wound was washed and dried and benzoin Steri- Strips were applied.  Sterile dressings were applied.  The patient was awakened from anesthesia and brought to the recovery room.  The patient tolerated the procedure well.   Velora Heckler, MD, [facility] Office: [phone]    TMG/MEDQ  D:  11/08/2012  T:  11/09/2012  Job:  [id number]

## 2012-11-09 NOTE — Discharge Summary (Signed)
Physician Discharge Summary [facility]  Patient ID: Stacie [name] MRN: [id number] DOB/AGE: 1960/11/02 52 y.o.  Admit date: 11/08/2012 Discharge date: 11/09/2012  Admission Diagnoses:  Multinodular goiter  Discharge Diagnoses:  Principal Problem:   Multinodular goiter (nontoxic)   Discharged Condition: good  Hospital Course: Patient was admitted for observation following thyroid surgery.  Post op course was uncomplicated.  Pain was well controlled.  Tolerated diet.  Post op calcium level on morning following surgery was 9.6.  Patient was prepared for discharge home on POD#1.  Consults: None  Significant Diagnostic Studies: labs: calcium  Treatments: surgery: total thyroidectomy  Discharge Exam: Blood pressure 146/79, pulse 98, temperature 98 F (36.7 C), temperature source Oral, resp. rate 18, height 5\' 4"  (1.626 m), weight 165 lb (74.844 kg), SpO2 95.00%. HEENT - clear Neck - wound clear and dry; minimal STS; voice mildly hoarse Chest - clear bilaterally Cor - RRR  Disposition: Home with family  Discharge Orders   Future Appointments Provider Department Dept Phone   11/28/2012 11:15 AM Velora Heckler, MD [facility], [place] [phone]   Future Orders Complete By Expires   Diet - low sodium heart healthy  As directed    Discharge instructions  As directed    Comments:     THYROID & PARATHYROID SURGERY - POST OP INSTRUCTIONS  Always review your discharge instruction sheet from the facility where your surgery was performed.  A prescription for pain medication may be given to you upon discharge.  Take your pain medication as prescribed.  If narcotic pain medicine is not needed, then you may take acetaminophen (Tylenol) or ibuprofen (Advil) as needed.  Take your usually prescribed medications unless otherwise directed.  If you need a refill on your pain medication, please contact your pharmacy. They will contact our office to request  authorization.  Prescriptions will not be processed after 5 pm or on weekends.  Start with a light diet upon arrival home, such as soup and crackers or toast.  Be sure to drink plenty of fluids daily.  Resume your normal diet the day after surgery.  Most patients will experience some swelling and bruising on the chest and neck area.  Ice packs will help.  Swelling and bruising can take several days to resolve.   It is common to experience some constipation if taking pain medication after surgery.  Increasing fluid intake and taking a stool softener will usually help or prevent this problem.  A mild laxative (Milk of Magnesia or Miralax) should be taken according to package directions if there are no bowel movements after 48 hours.  You may remove your bandages 24-48 hours after surgery, and you may shower at that time.  You have steri-strips (small skin tapes) in place directly over the incision.  These strips should be left on the skin for 7-10 days and then removed.  You may resume regular (light) daily activities beginning the next day-such as daily self-care, walking, climbing stairs-gradually increasing activities as tolerated.  You may have sexual intercourse when it is comfortable.  Refrain from any heavy lifting or straining until approved by your doctor.  You may drive when you no longer are taking prescription pain medication, you can comfortably wear a seatbelt, and you can safely maneuver your car and apply brakes.  You should see your doctor in the office for a follow-up appointment approximately two to three weeks after your surgery.  Make sure that you call for this appointment within a  day or two after you arrive home to insure a convenient appointment time.  WHEN TO CALL YOUR DOCTOR: -- Fever greater than 101.5 -- Inability to urinate -- Nausea and/or vomiting - persistent -- Extreme swelling or bruising -- Continued bleeding from incision -- Increased pain, redness, or drainage  from the incision -- Difficulty swallowing or breathing -- Muscle cramping or spasms -- Numbness or tingling in hands or around lips  The clinic staff is available to answer your questions during regular business hours.  Please don't hesitate to call and ask to speak to one of the nurses if you have concerns.  Velora Heckler, MD, FACS General & Endocrine Surgery [facility] Office: [phone]   Increase activity slowly  As directed    Remove dressing in 24 hours  As directed        Medication List         aspirin EC 81 MG tablet  Take 81 mg by mouth daily.     calcium carbonate 1250 MG tablet  Commonly known as:  OS-CAL - dosed in mg of elemental calcium  Take 2 tablets (1,000 mg of elemental calcium total) by mouth 2 (two) times daily with a meal.     diphenhydrAMINE 25 MG tablet  Commonly known as:  SOMINEX  Take 25 mg by mouth at bedtime as needed for sleep.     oxyCODONE-acetaminophen 5-325 MG per tablet  Commonly known as:  PERCOCET/ROXICET  Take 1-2 tablets by mouth every 4 (four) hours as needed for moderate pain.     pravastatin 20 MG tablet  Commonly known as:  PRAVACHOL  Take 20 mg by mouth every morning.     SYNTHROID 100 MCG tablet  Generic drug:  levothyroxine  Take 1 tablet (100 mcg total) by mouth daily.           Follow-up Information   Follow up with Velora Heckler, MD. Schedule an appointment as soon as possible for a visit in 3 weeks.   Specialty:  General Surgery   Contact information:   [address] [place] [address] [phone]       Velora Heckler, MD, [facility] Office: [phone]   Signed: Velora Heckler 11/09/2012, 6:47 AM

## 2012-11-09 NOTE — Progress Notes (Signed)
Quick Note:  Please contact patient and notify of benign pathology results.  Leaann Nevils M. Tyyonna Soucy, MD, FACS [facility] Office: [phone]   ______ 

## 2012-11-09 NOTE — Telephone Encounter (Signed)
Message copied by Joanette Gula on Fri Nov 09, 2012  3:35 PM ------      Message from: Velora Heckler      Created: Fri Nov 09, 2012  3:19 PM       Please contact patient and notify of benign pathology results.            Velora Heckler, MD, [facility]      Office: [phone]             ------

## 2012-11-09 NOTE — Telephone Encounter (Signed)
Pt home doing well. Po appt made. Pt aware lab slip being mailed.

## 2012-11-09 NOTE — Progress Notes (Signed)
Patient alert and oriented, vital signs are stable, incisions are within normal limits to neck area, patient tolerated breakfast with no nausea or vomiting, discharge instructions reviewed and questions and concerns answered, patient to follow up with MD Stanford Breed RN 11-09-2012 10:07am

## 2012-11-09 NOTE — Care Management Note (Signed)
    Page 1 of 1   11/09/2012     11:06:33 AM   CARE MANAGEMENT NOTE 11/09/2012  Patient:  Stacie [name], Stacie [name]   Account Number:  [id number]  Date Initiated:  11/09/2012  Documentation initiated by:  Lorenda Ishihara  Subjective/Objective Assessment:   52 yo female admitted s/p total thyroidectomy. PTA lived at home with spouse.     Action/Plan:   Home when stable   Anticipated DC Date:  11/09/2012   Anticipated DC Plan:  HOME/SELF CARE      DC Planning Services  CM consult      Choice offered to / List presented to:             Status of service:  Completed, signed off Medicare Important Message given?   (If response is "NO", the following Medicare IM given date fields will be blank) Date Medicare IM given:   Date Additional Medicare IM given:    Discharge Disposition:  HOME/SELF CARE  Per UR Regulation:  Reviewed for med. necessity/level of care/duration of stay  If discussed at Long Length of Stay Meetings, dates discussed:    Comments:

## 2012-11-09 NOTE — Telephone Encounter (Signed)
Pt notified of path result. 

## 2012-11-28 ENCOUNTER — Ambulatory Visit (INDEPENDENT_AMBULATORY_CARE_PROVIDER_SITE_OTHER): Payer: BC Managed Care – PPO | Admitting: Surgery

## 2012-11-28 ENCOUNTER — Encounter (INDEPENDENT_AMBULATORY_CARE_PROVIDER_SITE_OTHER): Payer: Self-pay | Admitting: Surgery

## 2012-11-28 ENCOUNTER — Encounter (INDEPENDENT_AMBULATORY_CARE_PROVIDER_SITE_OTHER): Payer: Self-pay

## 2012-11-28 VITALS — BP 122/70 | HR 66 | Temp 97.6°F | Resp 14 | Ht 64.0 in | Wt 167.6 lb

## 2012-11-28 DIAGNOSIS — E042 Nontoxic multinodular goiter: Secondary | ICD-10-CM

## 2012-11-28 NOTE — Patient Instructions (Signed)
  COCOA BUTTER & VITAMIN E CREAM  (Palmer's or other brand)  Apply cocoa butter/vitamin E cream to your incision 2 - 3 times daily.  Massage cream into incision for one minute with each application.  Use sunscreen (50 SPF or higher) for first 6 months after surgery if area is exposed to sun.  You may substitute Mederma or other scar reducing creams as desired.   

## 2012-11-28 NOTE — Progress Notes (Signed)
General Surgery [facility]  Chief Complaint  Patient presents with  . Routine Post Op    total thyroidectomy 11/08/2012    HISTORY: Patient is a 52 year old female who underwent total thyroidectomy on 11/08/2012. Final pathology showed multiple adenomatoid nodules. There is no evidence of malignancy.  Postoperative calcium level is normal at 9.2. Patient is currently taking Synthroid 100 mcg daily.  EXAM: Surgical incision is well-healed. Mild soft tissue swelling. No sign of seroma. No sign of infection. Voice quality is normal.  IMPRESSION: Status post total thyroidectomy for multiple adenomatoid nodules  PLAN: Patient will discontinue supplemental calcium at this time. She will begin applying topical creams to her incision. She will remain on her current dose of Synthroid. We will check her TSH level in 2 more weeks.  Patient will return for surgical followup in 6 weeks.  Velora Heckler, MD, FACS General & Endocrine Surgery [facility]   Visit Diagnoses: 1. Multinodular goiter (nontoxic)

## 2012-12-12 LAB — TSH: TSH: 0.627 u[IU]/mL (ref 0.350–4.500)

## 2013-01-09 ENCOUNTER — Ambulatory Visit (INDEPENDENT_AMBULATORY_CARE_PROVIDER_SITE_OTHER): Payer: BC Managed Care – PPO | Admitting: Surgery

## 2013-01-09 ENCOUNTER — Encounter (INDEPENDENT_AMBULATORY_CARE_PROVIDER_SITE_OTHER): Payer: Self-pay

## 2013-01-09 ENCOUNTER — Encounter (INDEPENDENT_AMBULATORY_CARE_PROVIDER_SITE_OTHER): Payer: Self-pay | Admitting: Surgery

## 2013-01-09 VITALS — BP 120/70 | HR 72 | Temp 98.7°F | Resp 14 | Ht 64.0 in | Wt 168.2 lb

## 2013-01-09 DIAGNOSIS — E890 Postprocedural hypothyroidism: Secondary | ICD-10-CM

## 2013-01-09 DIAGNOSIS — E042 Nontoxic multinodular goiter: Secondary | ICD-10-CM

## 2013-01-09 MED ORDER — SYNTHROID 100 MCG PO TABS: 100.0000 ug | ORAL_TABLET | Freq: Every day | ORAL | Status: DC

## 2013-01-09 NOTE — Progress Notes (Signed)
General Surgery [facility]  Chief Complaint  Patient presents with  . Routine Post Op    total thyroidectomy 11/08/2012    HISTORY: The patient is a 53 year old female who underwent total thyroidectomy in November 2014. Final pathology was benign. She is taking Synthroid 100 mcg daily. TSH level was normal at 0.627.  EXAM: Surgical incision is well-healed. There may be a tiny amount of keloid formation along the extent of the incision. No palpable masses. No sign of infection. Voice quality is normal.  IMPRESSION: Status post total thyroidectomy for multinodular goiter.  PLAN: Patient will continue to apply topical creams to her incision. I have recommended applying hydrocortisone cream once daily as this may help to thin the incision and prevent keloid formation.  I will renew her prescription for Synthroid 100 mcg daily. I have asked her to follow-up with her primary care physician for monitoring and for future prescriptions.  Patient will return for surgical care as needed.  Earnstine Regal, MD, [facility]   Visit Diagnoses: 1. Multinodular goiter (nontoxic)

## 2013-01-09 NOTE — Patient Instructions (Signed)
Hydrocortisone cream 2% - apply to incision once daily for two months.  Earnstine Regal, MD, [facility] Office: [phone]

## 2013-11-07 IMAGING — US US SOFT TISSUE HEAD/NECK
1 series · 14 of 25 positions shown · non-contrast
Comparison: 10/18/2010.

CLINICAL DATA: Follow up thyroid nodules.

THYROID ULTRASOUND
TECHNIQUE: Ultrasound examination of the thyroid gland and adjacent
soft tissues was performed.

[Series 1: us soft tissue head/neck · 0.06mm/px · 14 of 54 slices shown]
[im 1/54]
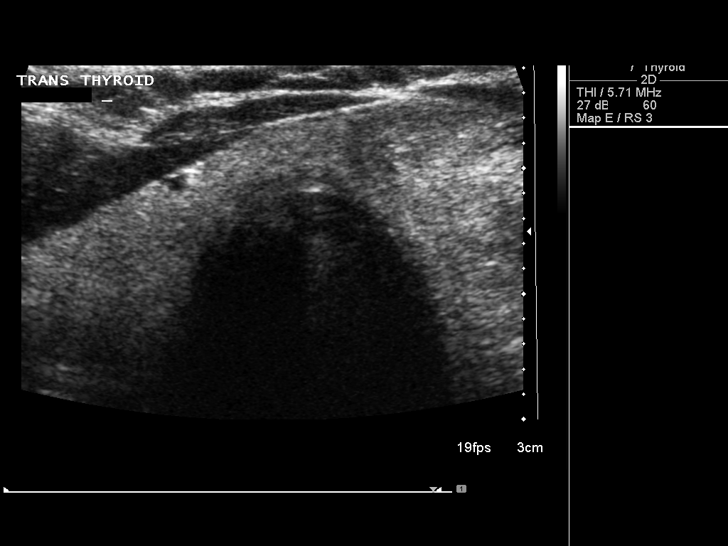
[im 5/54]
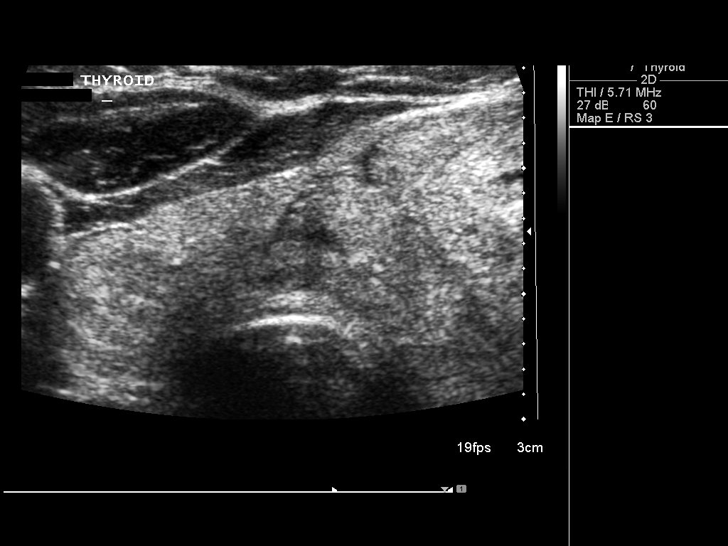
[im 9/54]
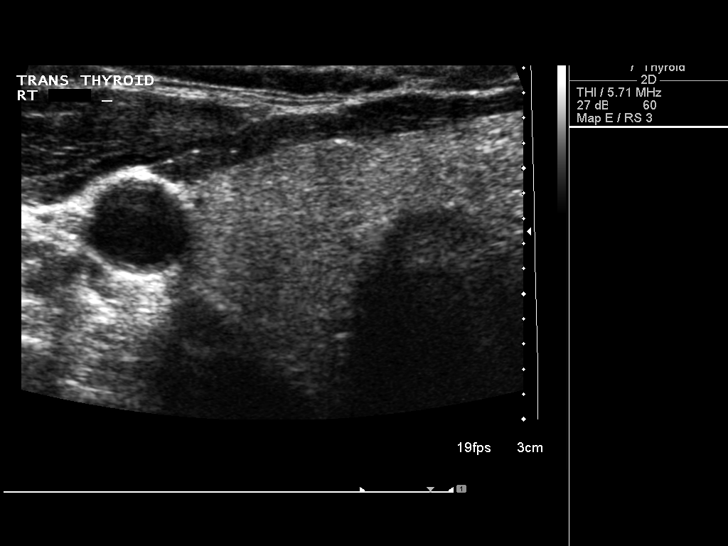
[im 14/54]
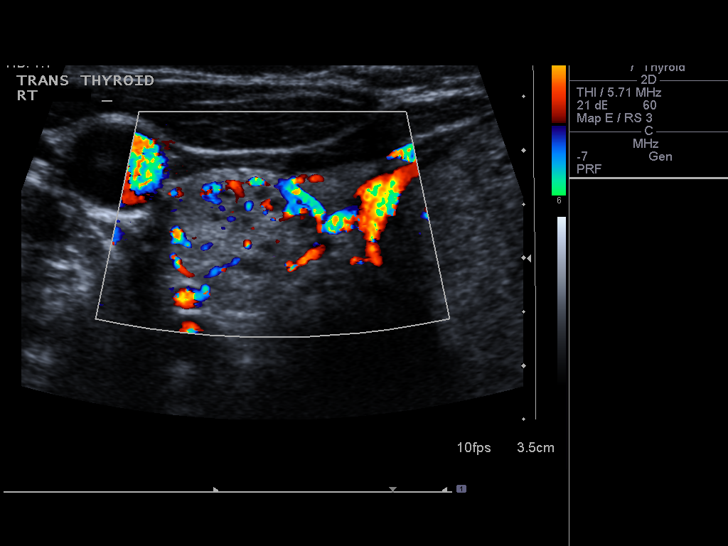
[im 18/54]
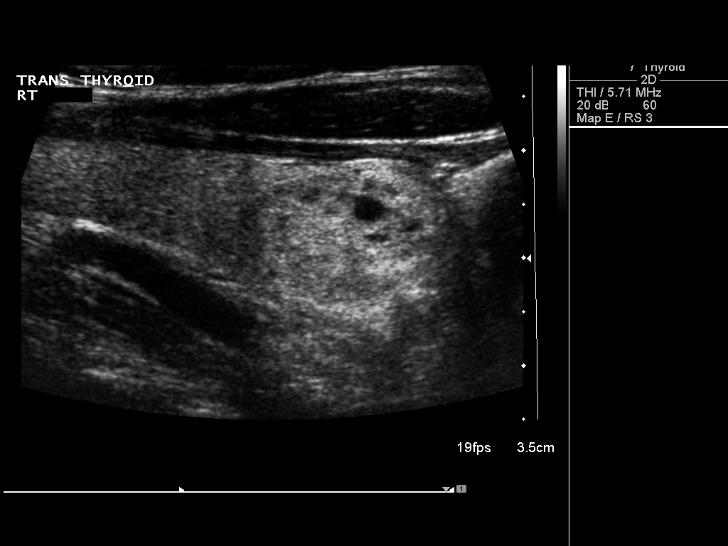
[im 20/54]
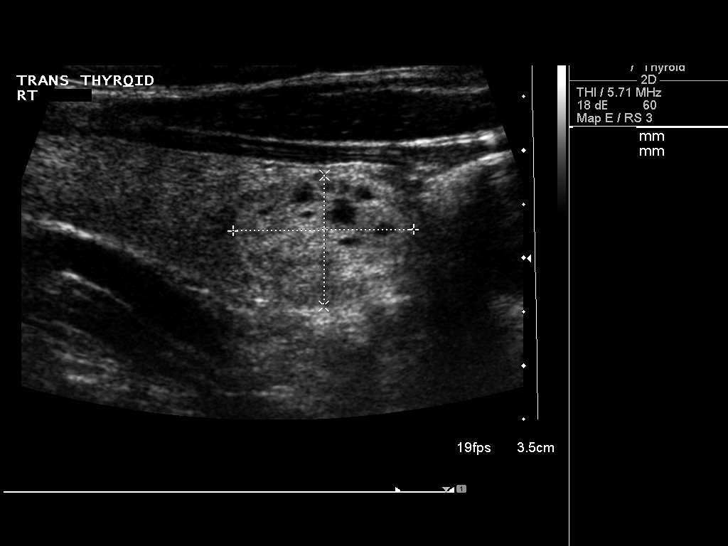
[im 25/54]
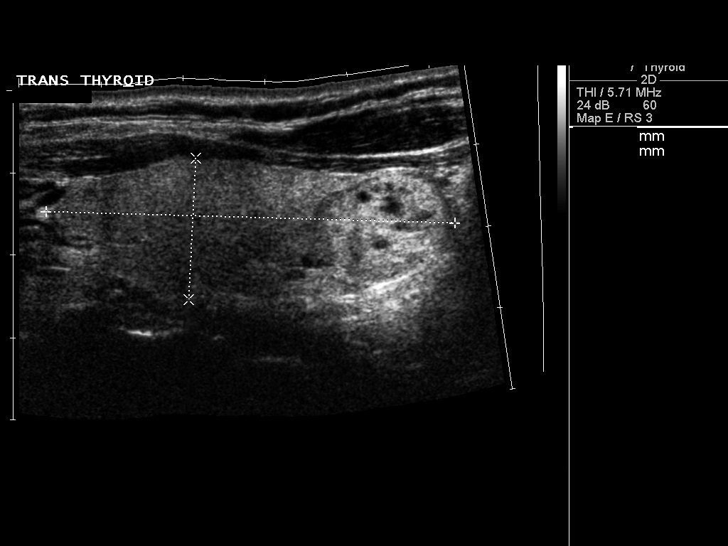
[im 29/54]
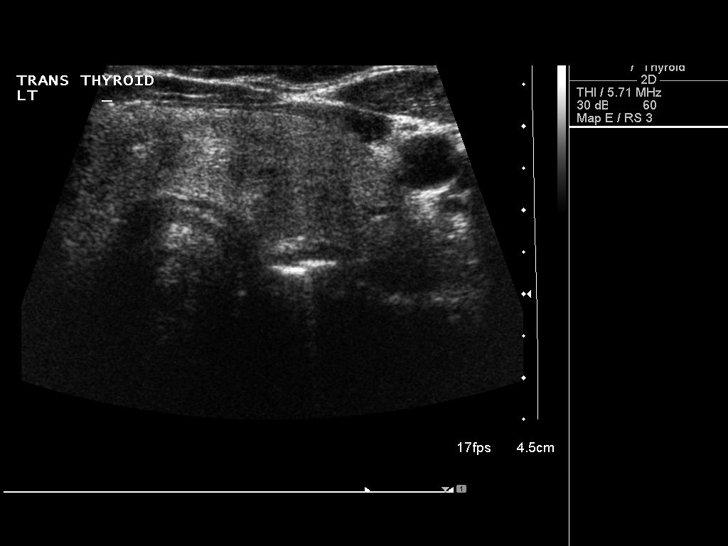
[im 34/54]
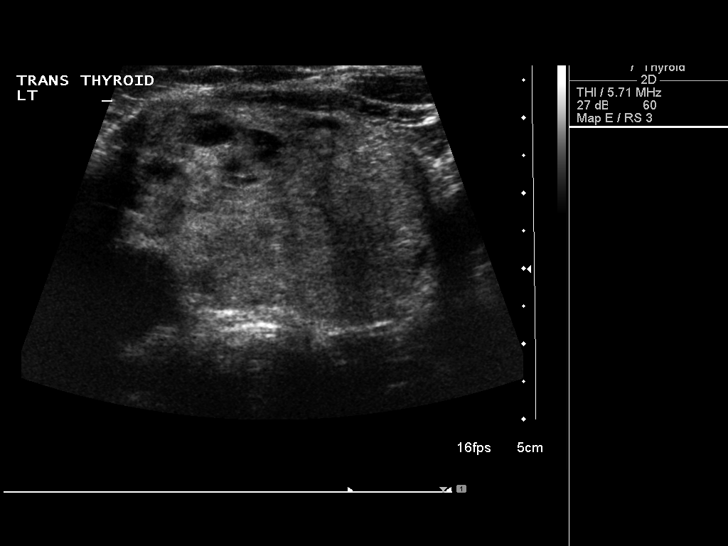
[im 36/54]
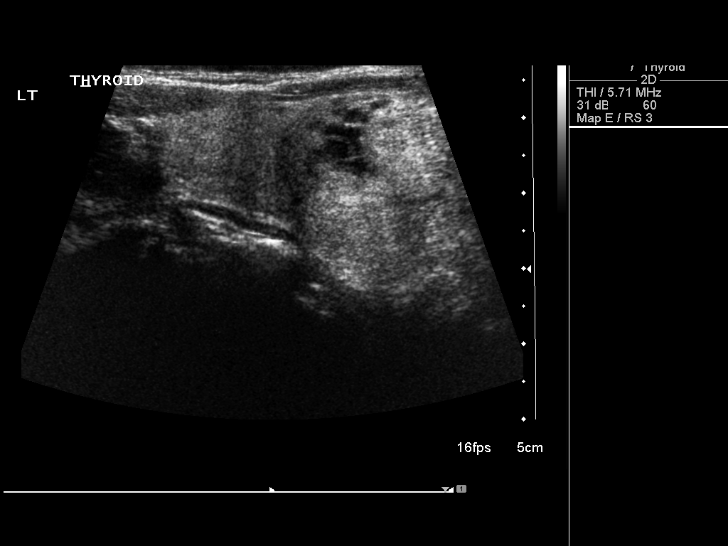
[im 40/54]
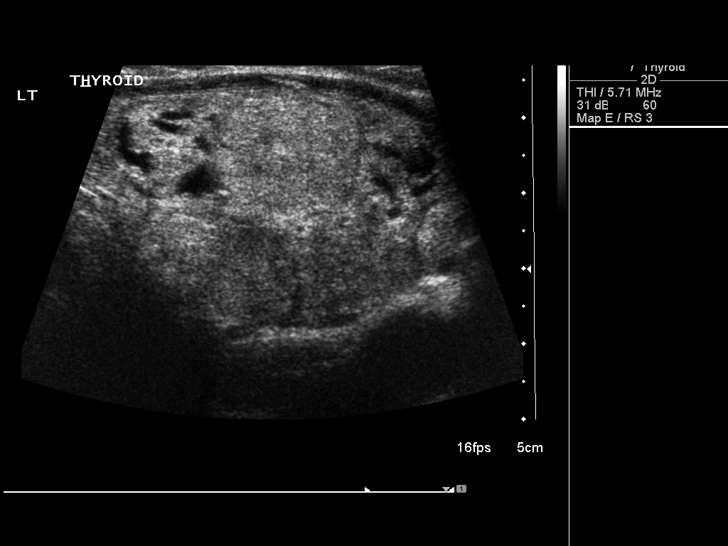
[im 45/54]
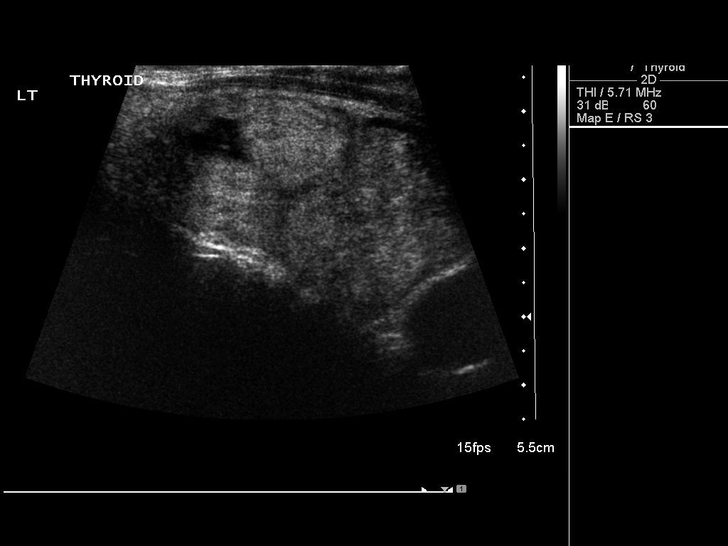
[im 49/54]
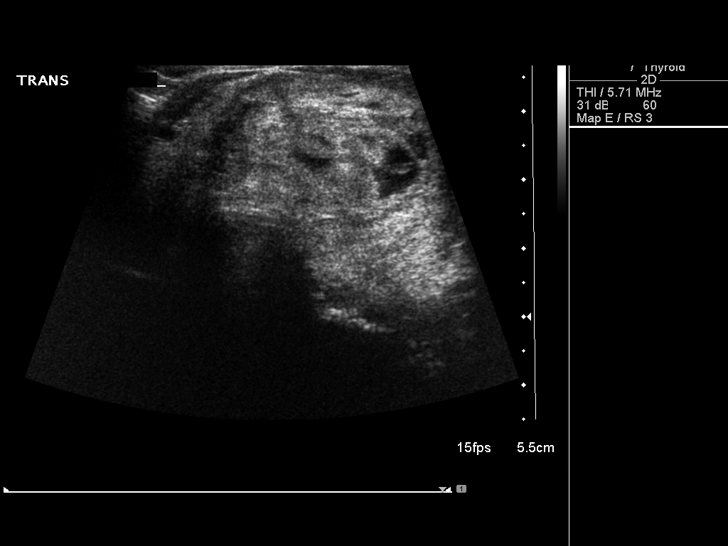
[im 54/54]
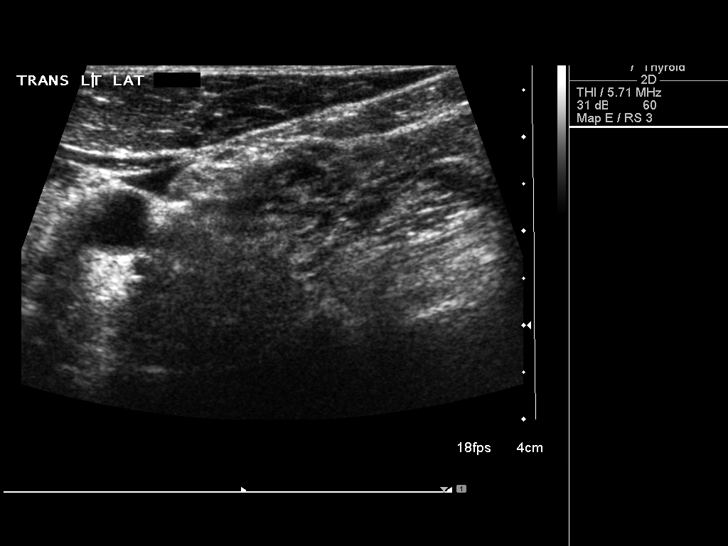

[14 of 25 positions shown; findings below may reference images not displayed]

FINDINGS: Right thyroid lobe:  5.0 x 1.7 x 1.7 cm.
Left thyroid lobe:  6.7 x 3.3 x 4.7 cm
Isthmus:  1.1 cm.

Homogeneous thyroid echotexture.

Focal nodules:  Stable solid right thyroid nodule in the lower pole
region which measures 1.7 x 1.2 x 1.4 cm.

Large mostly solid nodule occupying the left thyroid lobe measures
5.2 x 3.2 x 4.7 cm.  This lesion has been previously biopsied.  It
previously measured 4.8 x 2.8 x 4.4 cm.

Lymphadenopathy:  None visualized.
IMPRESSION: 1.  Stable right lower pole solid thyroid nodule.
2.  Slight interval enlargement of the large left thyroid nodule.
Recommend continued surveillance.

## 2014-11-08 IMAGING — CR DG CHEST 2V
2 series · 2 of 2 positions shown · non-contrast
Comparison: None.

CLINICAL DATA: Preop for thyroidectomy.

EXAM:
CHEST  2 VIEW

[w chest pa]
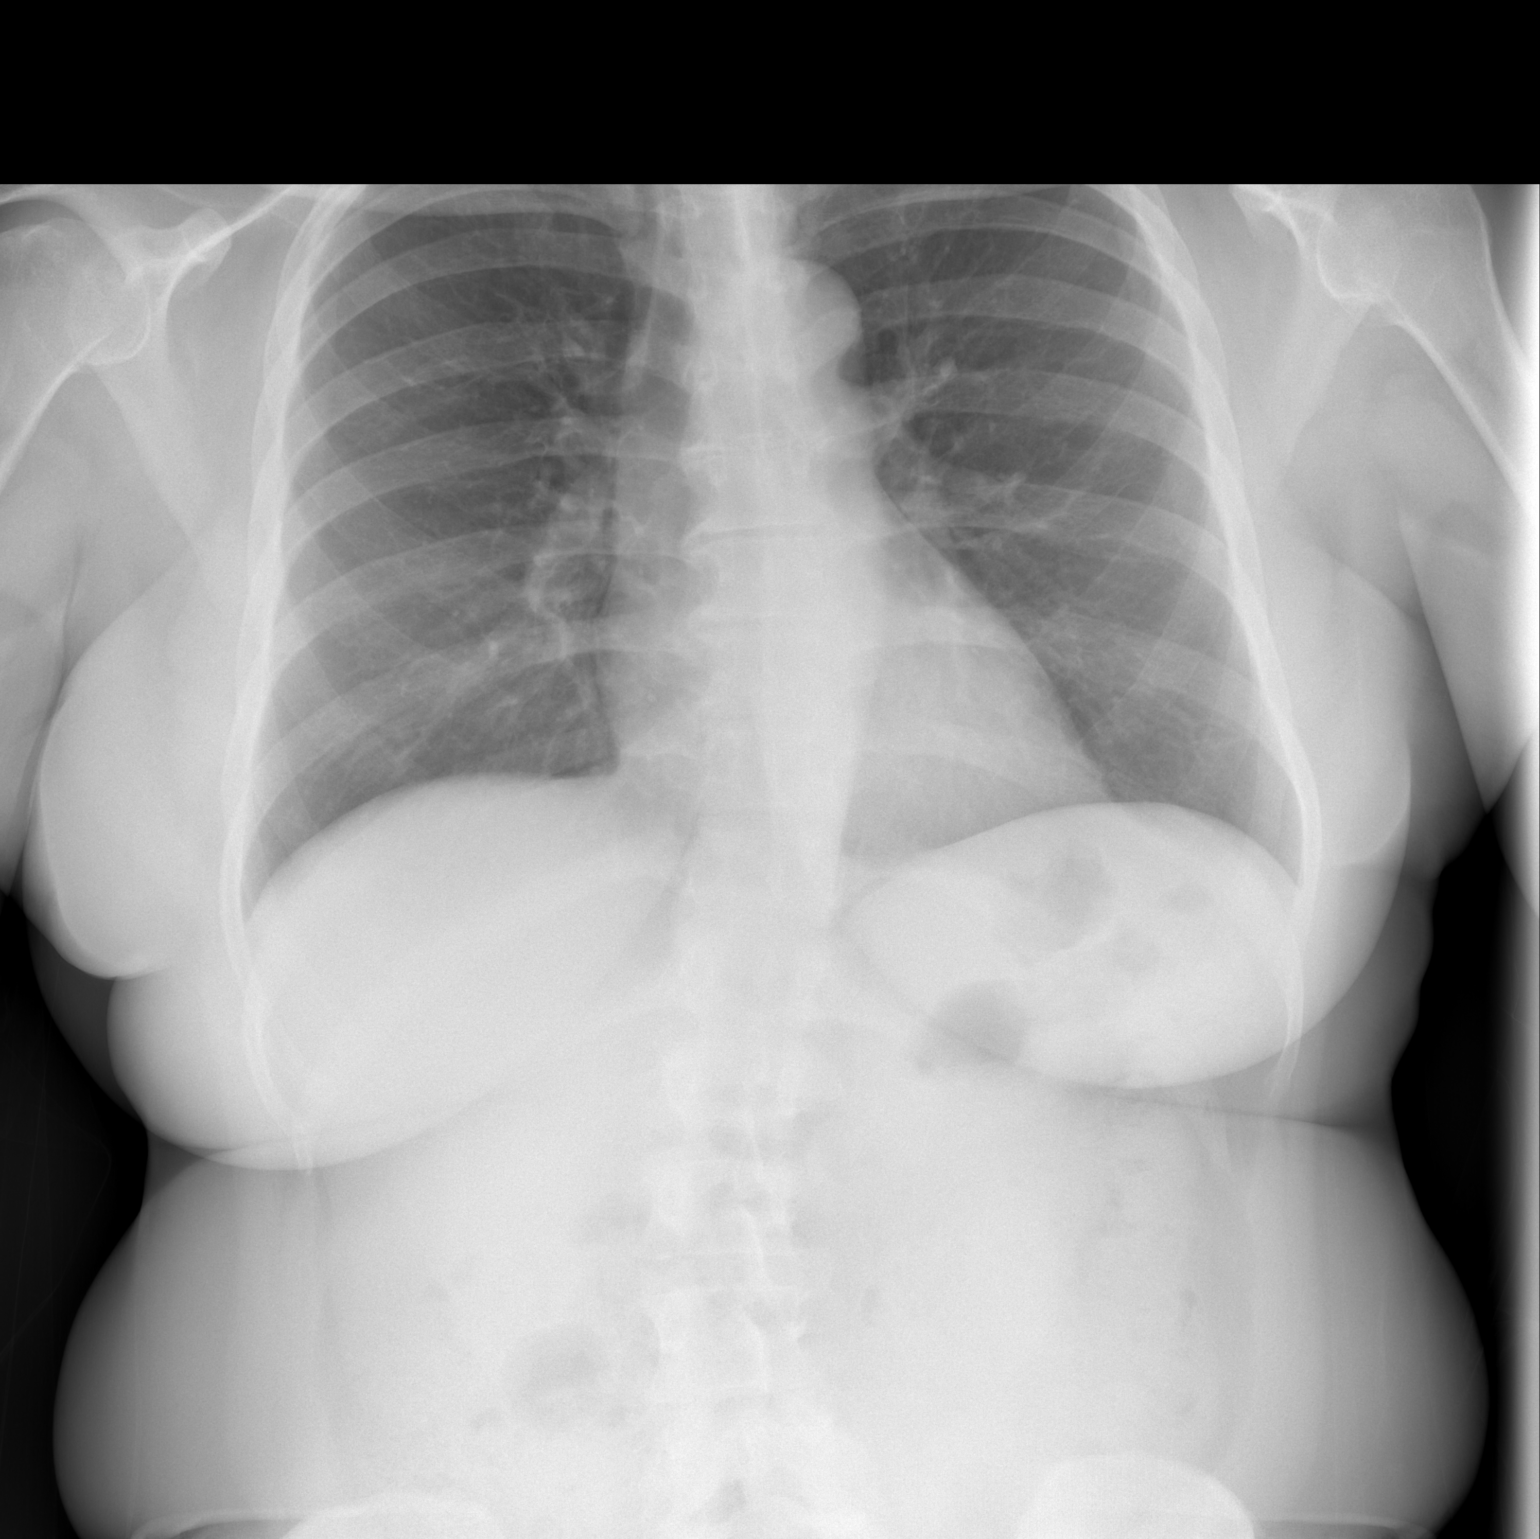

[w chest lat]
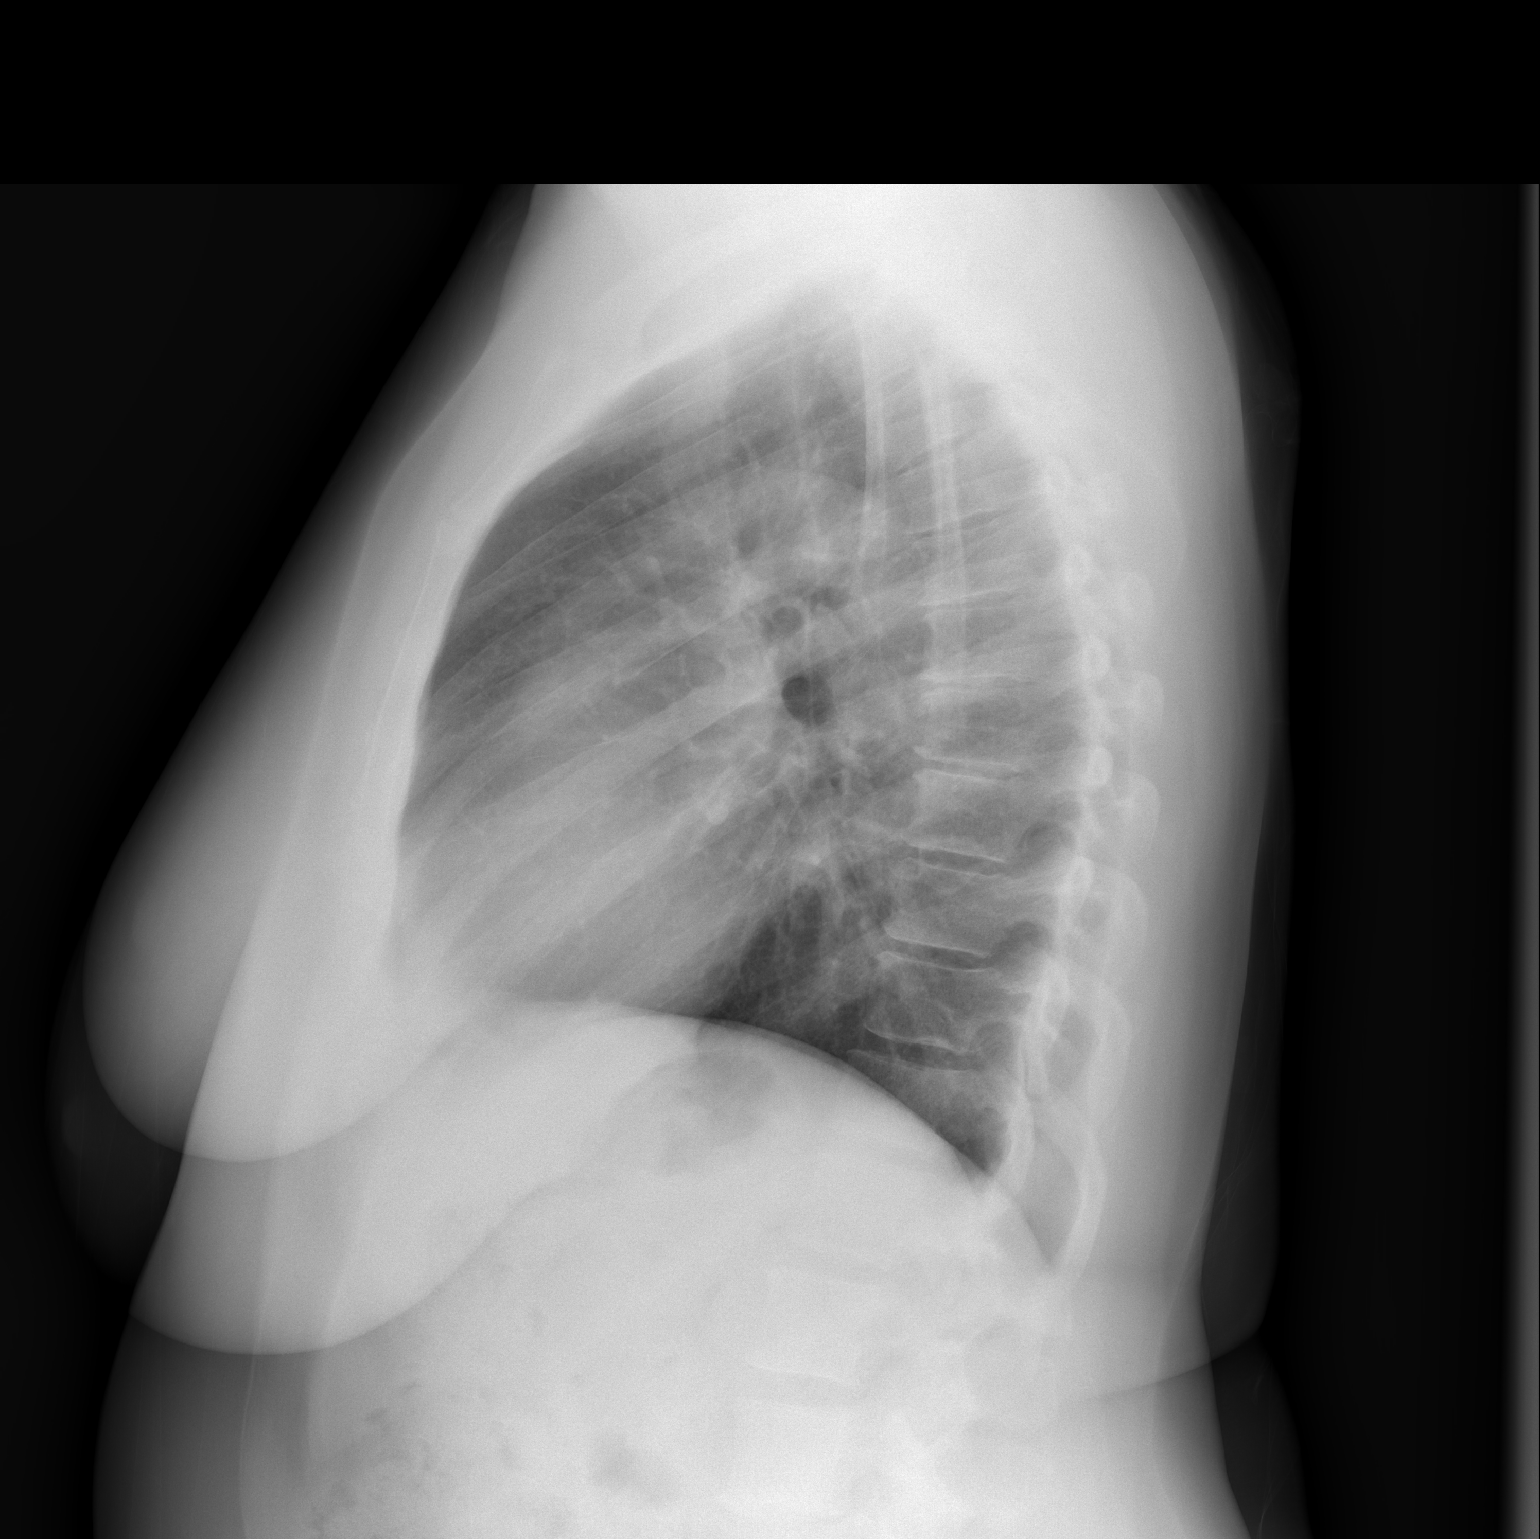

[2 of 2 positions shown; findings below may reference images not displayed]

FINDINGS: Two views of the chest demonstrate clear lungs. Heart and
mediastinum are within normal limits. The trachea is midline. Bony
thorax is intact.
IMPRESSION: No active cardiopulmonary disease.

## 2017-01-05 DIAGNOSIS — N6011 Diffuse cystic mastopathy of right breast: Secondary | ICD-10-CM | POA: Diagnosis not present

## 2017-01-05 DIAGNOSIS — N6314 Unspecified lump in the right breast, lower inner quadrant: Secondary | ICD-10-CM | POA: Diagnosis not present

## 2017-01-05 DIAGNOSIS — N6312 Unspecified lump in the right breast, upper inner quadrant: Secondary | ICD-10-CM | POA: Diagnosis not present

## 2017-03-09 DIAGNOSIS — Z0000 Encounter for general adult medical examination without abnormal findings: Secondary | ICD-10-CM | POA: Diagnosis not present

## 2017-03-09 DIAGNOSIS — R82998 Other abnormal findings in urine: Secondary | ICD-10-CM | POA: Diagnosis not present

## 2017-03-14 DIAGNOSIS — Z1212 Encounter for screening for malignant neoplasm of rectum: Secondary | ICD-10-CM | POA: Diagnosis not present

## 2017-03-31 DIAGNOSIS — Z0000 Encounter for general adult medical examination without abnormal findings: Secondary | ICD-10-CM | POA: Diagnosis not present

## 2017-03-31 DIAGNOSIS — Z1389 Encounter for screening for other disorder: Secondary | ICD-10-CM | POA: Diagnosis not present

## 2017-03-31 DIAGNOSIS — E119 Type 2 diabetes mellitus without complications: Secondary | ICD-10-CM | POA: Diagnosis not present

## 2017-03-31 DIAGNOSIS — I10 Essential (primary) hypertension: Secondary | ICD-10-CM | POA: Diagnosis not present

## 2017-11-16 DIAGNOSIS — E039 Hypothyroidism, unspecified: Secondary | ICD-10-CM | POA: Diagnosis not present

## 2017-11-16 DIAGNOSIS — R635 Abnormal weight gain: Secondary | ICD-10-CM | POA: Diagnosis not present

## 2017-11-16 DIAGNOSIS — M25551 Pain in right hip: Secondary | ICD-10-CM | POA: Diagnosis not present

## 2017-11-16 DIAGNOSIS — E119 Type 2 diabetes mellitus without complications: Secondary | ICD-10-CM | POA: Diagnosis not present

## 2017-11-16 DIAGNOSIS — E038 Other specified hypothyroidism: Secondary | ICD-10-CM | POA: Diagnosis not present

## 2018-03-06 DIAGNOSIS — N6315 Unspecified lump in the right breast, overlapping quadrants: Secondary | ICD-10-CM | POA: Diagnosis not present

## 2018-03-30 DIAGNOSIS — Z0000 Encounter for general adult medical examination without abnormal findings: Secondary | ICD-10-CM | POA: Diagnosis not present

## 2018-04-04 DIAGNOSIS — I10 Essential (primary) hypertension: Secondary | ICD-10-CM | POA: Diagnosis not present

## 2018-04-04 DIAGNOSIS — R82998 Other abnormal findings in urine: Secondary | ICD-10-CM | POA: Diagnosis not present

## 2018-04-06 DIAGNOSIS — Z0000 Encounter for general adult medical examination without abnormal findings: Secondary | ICD-10-CM | POA: Diagnosis not present

## 2018-04-27 ENCOUNTER — Emergency Department (HOSPITAL_COMMUNITY): Payer: 59

## 2018-04-27 ENCOUNTER — Encounter (HOSPITAL_COMMUNITY): Payer: Self-pay | Admitting: Emergency Medicine

## 2018-04-27 ENCOUNTER — Emergency Department (HOSPITAL_COMMUNITY)
Admission: EM | Admit: 2018-04-27 | Discharge: 2018-04-27 | Disposition: A | Discharge status: Home / Self Care | Payer: 59 | Attending: Emergency Medicine | Admitting: Emergency Medicine

## 2018-04-27 DIAGNOSIS — K8020 Calculus of gallbladder without cholecystitis without obstruction: Secondary | ICD-10-CM

## 2018-04-27 DIAGNOSIS — Z7982 Long term (current) use of aspirin: Secondary | ICD-10-CM | POA: Diagnosis not present

## 2018-04-27 DIAGNOSIS — Z79899 Other long term (current) drug therapy: Secondary | ICD-10-CM | POA: Insufficient documentation

## 2018-04-27 DIAGNOSIS — R7401 Elevation of levels of liver transaminase levels: Secondary | ICD-10-CM

## 2018-04-27 DIAGNOSIS — E039 Hypothyroidism, unspecified: Secondary | ICD-10-CM | POA: Insufficient documentation

## 2018-04-27 DIAGNOSIS — R109 Unspecified abdominal pain: Secondary | ICD-10-CM | POA: Diagnosis not present

## 2018-04-27 DIAGNOSIS — I10 Essential (primary) hypertension: Secondary | ICD-10-CM | POA: Diagnosis not present

## 2018-04-27 DIAGNOSIS — R740 Nonspecific elevation of levels of transaminase and lactic acid dehydrogenase [LDH]: Secondary | ICD-10-CM | POA: Insufficient documentation

## 2018-04-27 DIAGNOSIS — R1011 Right upper quadrant pain: Secondary | ICD-10-CM

## 2018-04-27 LAB — COMPREHENSIVE METABOLIC PANEL
ALT: 385 U/L — ABNORMAL HIGH (ref 0–44)
AST: 287 U/L — ABNORMAL HIGH (ref 15–41)
Albumin: 4.1 g/dL (ref 3.5–5.0)
Alkaline Phosphatase: 170 U/L — ABNORMAL HIGH (ref 38–126)
Anion gap: 11 (ref 5–15)
BUN: 10 mg/dL (ref 6–20)
CO2: 23 mmol/L (ref 22–32)
Calcium: 9.4 mg/dL (ref 8.9–10.3)
Chloride: 107 mmol/L (ref 98–111)
Creatinine, Ser: 0.81 mg/dL (ref 0.44–1.00)
GFR calc Af Amer: >60 mL/min (ref >60)
GFR calc non Af Amer: >60 mL/min (ref >60)
Glucose, Bld: 197 mg/dL — ABNORMAL HIGH (ref 70–99)
Potassium: 3.7 mmol/L (ref 3.5–5.1)
Sodium: 141 mmol/L (ref 135–145)
Total Bilirubin: 0.8 mg/dL (ref 0.3–1.2)
Total Protein: 7.2 g/dL (ref 6.5–8.1)

## 2018-04-27 LAB — URINALYSIS, ROUTINE W REFLEX MICROSCOPIC
Bilirubin Urine: NEGATIVE
Glucose, UA: NEGATIVE mg/dL
Hgb urine dipstick: NEGATIVE
Ketones, ur: NEGATIVE mg/dL
Leukocytes,Ua: NEGATIVE
Nitrite: NEGATIVE
Protein, ur: NEGATIVE mg/dL
Specific Gravity, Urine: 1.012 (ref 1.005–1.030)
pH: 5 (ref 5.0–8.0)

## 2018-04-27 LAB — LIPASE, BLOOD: Lipase: 28 U/L (ref 11–51)

## 2018-04-27 LAB — CBC
HCT: 38 % (ref 36.0–46.0)
Hemoglobin: 12.6 g/dL (ref 12.0–15.0)
MCH: 29.6 pg (ref 26.0–34.0)
MCHC: 33.2 g/dL (ref 30.0–36.0)
MCV: 89.2 fL (ref 80.0–100.0)
Platelets: 470 10*3/uL — ABNORMAL HIGH (ref 150–400)
RBC: 4.26 MIL/uL (ref 3.87–5.11)
RDW: 11.7 % (ref 11.5–15.5)
WBC: 7.8 10*3/uL (ref 4.0–10.5)
nRBC: 0 % (ref 0.0–0.2)

## 2018-04-27 MED ORDER — SODIUM CHLORIDE 0.9% FLUSH: 3.0000 mL | Freq: Once | INTRAVENOUS | Status: DC

## 2018-04-27 NOTE — ED Triage Notes (Signed)
Pt states she was told to come to ED for abnormal liver enzymes. Pt states seen originally went to [facility] due to upper abd pain.

## 2018-04-27 NOTE — Discharge Instructions (Signed)
Contact a health care provider if: You think you have had a gallbladder attack. You have been diagnosed with silent gallstones and you develop abdominal pain or indigestion. Get help right away if: You have pain from a gallbladder attack that lasts for more than 2 hours. You have abdominal pain that lasts for more than 5 hours. You have a fever or chills. You have persistent nausea and vomiting. You develop jaundice. You have dark urine or light-colored stools.

## 2018-04-27 NOTE — ED Notes (Signed)
Pt to ultrasound via wheelchair at this time.

## 2018-04-27 NOTE — ED Notes (Signed)
Patient transported to Ultrasound 

## 2018-04-27 NOTE — ED Provider Notes (Signed)
[place] EMERGENCY DEPARTMENT Provider Note   CSN: [id number] Arrival date & time: 04/27/18  1408    History   Chief Complaint Chief Complaint  Patient presents with  . Abnormal Lab    HPI Stacie [name] is a 58 y.o. female who presents with RUQ pain and elevated Liver enzymes. The patient states that her sxs have been intermittent for the past 5 days. On Monday, she had an episode of pain in her RUQ that lasted approximately 1 hour. 2 days ago she had the same pain that  Lasted for 1 hour. Last night the pain came on and was unrelenting all night long.  She describes the pain as aching, colicky, rating around the diaphragmatic region.  She had some mild associated nausea without vomiting.  She took a Tylenol which seemed to relieve her pain somewhat.  She was unable to fully get to sleep because the pain would wake her out of her sleep.  She presented at her PCPs office this morning and was called later and told to come to the emergency department for elevated liver enzymes.  Fevers, chills.  She currently does not have pain.  She denies vomiting, change in the color of her stools or urine.     HPI  Past Medical History:  Diagnosis Date  . Hyperlipidemia   . PONV (postoperative nausea and vomiting)    nausea only  . Thyroid nodule     Patient Active Problem List   Diagnosis Date Noted  . 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 ABDOMINAL HYSTERECTOMY  yrs ago  . APPENDECTOMY  age 70  . THYROIDECTOMY N/A 11/08/2012   Procedure:  TOTAL THYROIDECTOMY;  Surgeon: Earnstine Regal, MD;  Location: [facility];  Service: General;  Laterality: N/A;     OB History   No obstetric history on file.      Home Medications    Prior to Admission medications   Medication Sig Start Date End Date Taking? Authorizing Provider  aspirin EC 81 MG tablet Take 81 mg by mouth daily.    [provider]  calcium  carbonate (OS-CAL - DOSED IN MG OF ELEMENTAL CALCIUM) 1250 MG tablet Take 2 tablets (1,000 mg of elemental calcium total) by mouth 2 (two) times daily with a meal. 11/09/12   Armandina Gemma, MD  diphenhydrAMINE (SOMINEX) 25 MG tablet Take 25 mg by mouth at bedtime as needed for sleep.    [provider]  pravastatin (PRAVACHOL) 20 MG tablet Take 20 mg by mouth every morning.    [provider]  SYNTHROID 100 MCG tablet Take 1 tablet (100 mcg total) by mouth daily. 01/09/13   Armandina Gemma, MD    Family History Family History  Problem Relation Age of Onset  . Hypertension Mother   . Hypertension Father   . Diabetes Father   . Myasthenia gravis Father     Social History Social History   Tobacco Use  . Smoking status: Never Smoker  . Smokeless tobacco: Never Used  Substance Use Topics  . Alcohol use: No  . Drug use: No     Allergies   Darvocet [propoxyphene n-acetaminophen]   Review of Systems Review of Systems Ten systems reviewed and are negative for acute change, except as noted in the HPI.    Physical Exam Updated Vital Signs BP (!) 156/86 (BP Location: Right Arm)   Pulse 89   Temp 99.1 F (37.3  C) (Oral)   Resp 18   SpO2 95%   Physical Exam  Physical Exam  Nursing note and vitals reviewed. Constitutional: She is oriented to person, place, and time. She appears well-developed and well-nourished. No distress.  HENT:  Head: Normocephalic and atraumatic.  Eyes: Conjunctivae normal and EOM are normal. Pupils are equal, round, and reactive to light. No scleral icterus.  Neck: Normal range of motion.  Cardiovascular: Normal rate, regular rhythm and normal heart sounds.  Exam reveals no gallop and no friction rub.  No murmur heard. Pulmonary/Chest: Effort normal and breath sounds normal. No respiratory distress.  Abdominal: Soft. Bowel sounds are normal. She exhibits no distension and no mass.  Tender to palpation in the right upper quadrant. there is no  guarding.  Neurological: She is alert and oriented to person, place, and time.  Skin: Skin is warm and dry. She is not diaphoretic.    ED Treatments / Results  Labs (all labs ordered are listed, but only abnormal results are displayed) Labs Reviewed  CBC - Abnormal; Notable for the following components:      Result Value   Platelets 470 (*)    All other components within normal limits  LIPASE, BLOOD  COMPREHENSIVE METABOLIC PANEL  URINALYSIS, ROUTINE W REFLEX MICROSCOPIC    EKG None  Radiology No results found.  Procedures Procedures (including critical care time)  Medications Ordered in ED Medications  sodium chloride flush (NS) 0.9 % injection 3 mL (has no administration in time range)     Initial Impression / Assessment and Plan / ED Course  I have reviewed the triage vital signs and the nursing notes.  Pertinent labs & imaging results that were available during my care of the patient were reviewed by me and considered in my medical decision making (see chart for details).        CC:RUQ abdominal pain VS: BP (!) 156/86 (BP Location: Right Arm)   Pulse 89   Temp 99.1 F (37.3 C) (Oral)   Resp 18   SpO2 95%  BP:ZWCHENI is gathered by patient  and EMR. DDX:The emergent DDX for RUQ pain includes but is not limited to Glabladder disease, PUD, Acute Hepatitis, Pancreatitis, pyelonephritis, Pneumonia, Lower lobe PE/Infarct, Kidney stone, GERD, retrocecal appendicitis, Fitz-Hugh-Curtis syndrome, AAA, MI, Zoster. Labs: I reviewed the labs which show Transaminitis without bilirubinemia or elevated lipase. No leukocytosis. + hyperglycemia Imaging: I personally reviewed the images ([place] of RUQ) which show(s) Large gallstone without chole cystitis or dilated common bile duct EKG: Not applicable MDM: Symptomatic cholelithiasis with transaminitis.  I discussed the case with Dr. Georganna Skeans.  Patient will be followed closely this coming Monday at [facility] surgery.  Not feel she needs admission at this time.  She is pain-free without nausea or vomiting.  Case discussed with Dr. Darl Householder who is in agreement with plan Patient disposition: Discharge Patient condition: Good. The patient appears reasonably screened and/or stabilized for discharge and I doubt any other medical condition or other [facility] requiring further screening, evaluation, or treatment in the ED at this time prior to discharge. I have discussed lab and/or imaging findings with the patient and answered all questions/concerns to the best of my ability. I have discussed return precautions and OP follow up.      Final Clinical Impressions(s) / ED Diagnoses   Final diagnoses:  RUQ abdominal pain  Transaminitis    ED Discharge Orders    None       Margarita Mail, PA-C  04/27/18 2307    Drenda Freeze, MD 05/02/18 2032

## 2018-05-03 ENCOUNTER — Ambulatory Visit: Payer: Self-pay | Admitting: Surgery

## 2018-05-03 DIAGNOSIS — K8010 Calculus of gallbladder with chronic cholecystitis without obstruction: Secondary | ICD-10-CM | POA: Diagnosis not present

## 2018-05-03 DIAGNOSIS — R748 Abnormal levels of other serum enzymes: Secondary | ICD-10-CM | POA: Diagnosis not present

## 2018-05-03 DIAGNOSIS — R945 Abnormal results of liver function studies: Secondary | ICD-10-CM | POA: Diagnosis not present

## 2018-05-04 ENCOUNTER — Other Ambulatory Visit: Payer: Self-pay

## 2018-05-04 ENCOUNTER — Encounter (HOSPITAL_COMMUNITY): Payer: Self-pay | Admitting: *Deleted

## 2018-05-04 ENCOUNTER — Inpatient Hospital Stay (HOSPITAL_COMMUNITY): Admission: RE | Admit: 2018-05-04 | Payer: PRIVATE HEALTH INSURANCE | Source: Ambulatory Visit

## 2018-05-04 NOTE — Progress Notes (Signed)
Abbreviations in consent form.  Need to correct abbreviations on consent form.  Thank You.  Surgery on 05/11/2018

## 2018-05-07 ENCOUNTER — Encounter (HOSPITAL_COMMUNITY): Payer: Self-pay | Admitting: Surgery

## 2018-05-07 DIAGNOSIS — K8010 Calculus of gallbladder with chronic cholecystitis without obstruction: Secondary | ICD-10-CM | POA: Diagnosis present

## 2018-05-07 NOTE — H&P (Signed)
General Surgery [facility]  Stacie [name] Documented: 05/03/2018 2:11 PM Location: [facility] Patient #: [id number] DOB: [birth year] Married / Language: English / Race: Black or African American Female   History of Present Illness Stacie Regal MD; 05/03/2018 2:58 PM) The patient is a 58 year old female who presents for evaluation of gall stones.  CHIEF COMPLAINT: symptomatic cholelithiasis, chronic cholecystitis, elevated LFT's  Patient is referred by Dr. Leanna Battles and by the staff in the emergency department for evaluation of symptomatic cholelithiasis, chronic cholecystitis, and elevated liver function test. Hand had intermittent epigastric abdominal pain for approximately 6 months. Last week she experienced 3 consecutive days of abdominal pain. This was relatively persistent. She was seen in the physician's office and noted to have elevated liver function test. She was referred to the emergency department where she had repeat laboratory studies and underwent an ultrasound examination of the abdomen on April 27, 2018. This demonstrated a large shadowing gallstone measuring 2.7 cm. Biliary tree was not dilated. There was no pericholecystic fluid. Patient was discharged home taking ibuprofen for pain. Laboratory studies at her emergency department visit demonstrated elevated transaminases of 287 and 385 and an elevated alkaline phosphatase of 170. Total bilirubin was normal at 0.8. She has not had repeat liver function test in the past week. Patient has noted resolution of her pain. Prior abdominal surgery includes appendectomy and hysterectomy. There is no family history of gallbladder disease. Patient denies any history of hepatobiliary or pancreatic disease.   Past Surgical History Emeline Gins, [place]; 05/03/2018 2:11 PM) Appendectomy  Colon Polyp Removal - Colonoscopy  Hysterectomy (not due to cancer) - Partial  Thyroid Surgery    Diagnostic Studies History Emeline Gins, [place]; 05/03/2018 2:11 PM) Colonoscopy  5-10 years ago Mammogram  within last year Pap Smear  >5 years ago  Allergies Emeline Gins, CMA; 05/03/2018 2:12 PM) No Known Drug Allergies [05/03/2018]: Allergies Reconciled   Medication History Emeline Gins, CMA; 05/03/2018 2:13 PM) amLODIPine Besylate (5MG  Tablet, Oral) Active. Pravastatin Sodium (20MG  Tablet, Oral) Active. Synthroid (112MCG Tablet, Oral) Active. metFORMIN HCl ER (500MG  Tablet ER 24HR, Oral) Active. Medications Reconciled  Social History Emeline Gins, [place]; 05/03/2018 2:11 PM) Alcohol use  Occasional alcohol use. Caffeine use  Tea. No drug use  Tobacco use  Never smoker.  Family History Emeline Gins, [place]; 05/03/2018 2:11 PM) Arthritis  Father. Diabetes Mellitus  Father. Heart Disease  Father. Hypertension  Father, Mother. Migraine Headache  Daughter, Father. Respiratory Condition  Father. Thyroid problems  Father.  Pregnancy / Birth History Emeline Gins, [place]; 05/03/2018 2:11 PM) Age at menarche  30 years. Age of menopause  <45 Gravida  2 Maternal age  9-20 Para  2  Other Problems Emeline Gins, [place]; 05/03/2018 2:11 PM) High blood pressure  Hypercholesterolemia  Thyroid Disease     Review of Systems Emeline Gins CMA; 05/03/2018 2:11 PM) General Present- Fatigue and Night Sweats. Not Present- Appetite Loss, Chills, Fever, Weight Gain and Weight Loss. Skin Not Present- Change in Wart/Mole, Dryness, Hives, Jaundice, New Lesions, Non-Healing Wounds, Rash and Ulcer. HEENT Present- Seasonal Allergies and Wears glasses/contact lenses. Not Present- Earache, Hearing Loss, Hoarseness, Nose Bleed, Oral Ulcers, Ringing in the Ears, Sinus Pain, Sore Throat, Visual Disturbances and Yellow Eyes. Respiratory Not Present- Bloody sputum, Chronic Cough, Difficulty Breathing, Snoring and Wheezing. Breast Not Present- Breast Mass,  Breast Pain, Nipple Discharge and Skin Changes. Cardiovascular Present- Swelling of Extremities. Not Present- Chest Pain, Difficulty Breathing Lying Down, Leg  Cramps, Palpitations, Rapid Heart Rate and Shortness of Breath. Gastrointestinal Present- Abdominal Pain and Hemorrhoids. Not Present- Bloating, Bloody Stool, Change in Bowel Habits, Chronic diarrhea, Constipation, Difficulty Swallowing, Excessive gas, Gets full quickly at meals, Indigestion, Nausea, Rectal Pain and Vomiting. Female Genitourinary Not Present- Frequency, Nocturia, Painful Urination, Pelvic Pain and Urgency. Musculoskeletal Not Present- Back Pain, Joint Pain, Joint Stiffness, Muscle Pain, Muscle Weakness and Swelling of Extremities. Neurological Not Present- Decreased Memory, Fainting, Headaches, Numbness, Seizures, Tingling, Tremor, Trouble walking and Weakness. Psychiatric Not Present- Anxiety, Bipolar, Change in Sleep Pattern, Depression, Fearful and Frequent crying. Endocrine Present- Hot flashes and New Diabetes. Not Present- Cold Intolerance, Excessive Hunger, Hair Changes and Heat Intolerance. Hematology Not Present- Blood Thinners, Easy Bruising, Excessive bleeding, Gland problems, HIV and Persistent Infections.  Vitals Emeline Gins CMA; 05/03/2018 2:12 PM) 05/03/2018 2:11 PM Weight: 177 lb Height: 64in Body Surface Area: 1.86 m Body Mass Index: 30.38 kg/m  Temp.: 98.73F  Pulse: 86 (Regular)  BP: 124/80 (Sitting, Left Arm, Standard)       Physical Exam Stacie Regal MD; 05/03/2018 2:59 PM) The physical exam findings are as follows: Note:See vital signs recorded above  GENERAL APPEARANCE Development: normal Nutritional status: normal Gross deformities: none  SKIN Rash, lesions, ulcers: none Induration, erythema: none Nodules: none palpable  EYES Conjunctiva and lids: normal Pupils: equal and reactive Iris: normal bilaterally  EARS, NOSE, MOUTH, THROAT External ears: no lesion or  deformity External nose: no lesion or deformity Hearing: grossly normal Lips: no lesion or deformity Dentition: normal for age Oral mucosa: moist  NECK Symmetric: yes Trachea: midline Thyroid: no palpable nodules in the thyroid bed Well-healed anterior cervical incision with good cosmetic result  CHEST Respiratory effort: normal Retraction or accessory muscle use: no Breath sounds: normal bilaterally Rales, rhonchi, wheeze: none  CARDIOVASCULAR Auscultation: regular rhythm, normal rate Murmurs: none Pulses: carotid and radial pulse 2+ palpable Lower extremity edema: none Lower extremity varicosities: none  ABDOMEN Distension: none Masses: none palpable Tenderness: none Hepatosplenomegaly: not present Hernia: Laxity at the base of the umbilicus consistent with small umbilical hernia or attenuated fascia.  MUSCULOSKELETAL Station and gait: normal Digits and nails: no clubbing or cyanosis Muscle strength: grossly normal all extremities Range of motion: grossly normal all extremities Deformity: none  LYMPHATIC Cervical: none palpable Supraclavicular: none palpable  PSYCHIATRIC Oriented to person, place, and time: yes Mood and affect: normal for situation Judgment and insight: appropriate for situation    Assessment & Plan Stacie Regal MD; 05/03/2018 3:01 PM) CHOLELITHIASIS WITH CHRONIC CHOLECYSTITIS (K80.10) Current Plans Pt Education - Pamphlet Given - Laparoscopic Gallbladder Surgery: discussed with patient and provided information. Patient is referred for evaluation of chronic cholecystitis, cholelithiasis, and elevated liver function test. She is provided with written literature on gallbladder surgery to review at home.  Patient appears to have been having episodes of biliary colic for approximately 6 months. Last week she developed persistent discomfort for approximately 3 days. Laboratory studies showed elevated liver function tests which likely  represent an episode of choledocholithiasis. Patient's symptoms have improved. We plan to repeat her liver function tests today.  Patient will require cholecystectomy with intraoperative cholangiography. Based on her symptoms and laboratory studies, I think this should be done sooner rather than later. We discussed laparoscopic cholecystectomy. We discussed the potential for conversion to open surgery. We discussed the hospital stay to be expected. We discussed the postoperative recovery and return to normal activities. The patient understands and wishes to proceed with surgery in  the near future.  The risks and benefits of the procedure have been discussed at length with the patient. The patient understands the proposed procedure, potential alternative treatments, and the course of recovery to be expected. All of the patient's questions have been answered at this time. The patient wishes to proceed with surgery.   Armandina Gemma, [facility] Office: [phone]

## 2018-05-09 ENCOUNTER — Other Ambulatory Visit: Payer: Self-pay

## 2018-05-09 ENCOUNTER — Encounter (HOSPITAL_COMMUNITY)
Admission: RE | Admit: 2018-05-09 | Discharge: 2018-05-09 | Disposition: A | Discharge status: Home / Self Care | Payer: 59 | Source: Ambulatory Visit | Attending: Surgery | Admitting: Surgery

## 2018-05-09 DIAGNOSIS — Z01818 Encounter for other preprocedural examination: Secondary | ICD-10-CM | POA: Insufficient documentation

## 2018-05-09 DIAGNOSIS — Z7984 Long term (current) use of oral hypoglycemic drugs: Secondary | ICD-10-CM | POA: Diagnosis not present

## 2018-05-09 DIAGNOSIS — K8010 Calculus of gallbladder with chronic cholecystitis without obstruction: Secondary | ICD-10-CM | POA: Diagnosis not present

## 2018-05-09 DIAGNOSIS — R748 Abnormal levels of other serum enzymes: Secondary | ICD-10-CM | POA: Diagnosis not present

## 2018-05-09 DIAGNOSIS — E785 Hyperlipidemia, unspecified: Secondary | ICD-10-CM | POA: Diagnosis not present

## 2018-05-09 DIAGNOSIS — Z1159 Encounter for screening for other viral diseases: Secondary | ICD-10-CM | POA: Insufficient documentation

## 2018-05-09 DIAGNOSIS — E119 Type 2 diabetes mellitus without complications: Secondary | ICD-10-CM | POA: Diagnosis not present

## 2018-05-09 DIAGNOSIS — I10 Essential (primary) hypertension: Secondary | ICD-10-CM | POA: Diagnosis not present

## 2018-05-09 LAB — BASIC METABOLIC PANEL
Anion gap: 6 (ref 5–15)
BUN: 11 mg/dL (ref 6–20)
CO2: 28 mmol/L (ref 22–32)
Calcium: 8.9 mg/dL (ref 8.9–10.3)
Chloride: 105 mmol/L (ref 98–111)
Creatinine, Ser: 0.74 mg/dL (ref 0.44–1.00)
GFR calc Af Amer: >60 mL/min (ref >60)
GFR calc non Af Amer: >60 mL/min (ref >60)
Glucose, Bld: 167 mg/dL — ABNORMAL HIGH (ref 70–99)
Potassium: 3.4 mmol/L — ABNORMAL LOW (ref 3.5–5.1)
Sodium: 139 mmol/L (ref 135–145)

## 2018-05-09 LAB — CBC
HCT: 38.5 % (ref 36.0–46.0)
Hemoglobin: 12.7 g/dL (ref 12.0–15.0)
MCH: 30.2 pg (ref 26.0–34.0)
MCHC: 33 g/dL (ref 30.0–36.0)
MCV: 91.7 fL (ref 80.0–100.0)
Platelets: 453 10*3/uL — ABNORMAL HIGH (ref 150–400)
RBC: 4.2 MIL/uL (ref 3.87–5.11)
RDW: 11.8 % (ref 11.5–15.5)
WBC: 7.9 10*3/uL (ref 4.0–10.5)
nRBC: 0 % (ref 0.0–0.2)

## 2018-05-09 LAB — HEMOGLOBIN A1C
Hgb A1c MFr Bld: 7.5 % — ABNORMAL HIGH (ref 4.8–5.6)
Mean Plasma Glucose: 168.55 mg/dL

## 2018-05-10 ENCOUNTER — Other Ambulatory Visit: Payer: Self-pay

## 2018-05-10 ENCOUNTER — Other Ambulatory Visit (HOSPITAL_COMMUNITY)
Admission: RE | Admit: 2018-05-10 | Discharge: 2018-05-10 | Disposition: A | Discharge status: Home / Self Care | Payer: 59 | Source: Ambulatory Visit | Attending: Surgery | Admitting: Surgery

## 2018-05-10 DIAGNOSIS — K8010 Calculus of gallbladder with chronic cholecystitis without obstruction: Secondary | ICD-10-CM | POA: Diagnosis not present

## 2018-05-10 NOTE — Progress Notes (Signed)
Anesthesia Chart Review   Case:  [id number] Date/Time:  05/11/18 0715   Procedure:  LAPAROSCOPIC CHOLECYSTECTOMY WITH INTRAOPERATIVE CHOLANGIOGRAM (N/A )   Anesthesia type:  General   Pre-op diagnosis:  CHRONIC CHOLECYSTITIS, CHOLELITHIASIS, ELEVATED LFTS   Location:  [facility] ROOM 02 / [facility]   Surgeon:  Armandina Gemma, MD      DISCUSSION:58 yo never smoker with h/o PONV, HLD, hypothyroidism, HTN, pre-diabetes, chronic cholecystitis, cholelithiasis, elevated LFTs scheduled for above procedure 05/11/18 with Dr. Armandina Gemma.   Pt can proceed with planned procedure barring acute status change.  VS: There were no vitals taken for this visit.  PROVIDERS: Leanna Battles, MD is PCP    LABS: Labs reviewed: Acceptable for surgery. (all labs ordered are listed, but only abnormal results are displayed)  Labs Reviewed  HEMOGLOBIN A1C - Abnormal; Notable for the following components:      Result Value   Hgb A1c MFr Bld 7.5 (*)    All other components within normal limits     IMAGES:   EKG: 05/09/2018 Rate 65 bpm  Normal sinus rhythm  Normal ECG  CV:  Past Medical History:  Diagnosis Date  . Hyperlipidemia   . Hypertension   . Hypothyroidism   . Pneumonia    hx of   . PONV (postoperative nausea and vomiting)    nausea only  . Pre-diabetes   . Thyroid nodule     Past Surgical History:  Procedure Laterality Date  . ABDOMINAL HYSTERECTOMY  yrs ago  . APPENDECTOMY  age 68  . THYROIDECTOMY N/A 11/08/2012   Procedure:  TOTAL THYROIDECTOMY;  Surgeon: Earnstine Regal, MD;  Location: [facility];  Service: General;  Laterality: N/A;    MEDICATIONS: . amLODipine (NORVASC) 5 MG tablet  . ibuprofen (ADVIL) 200 MG tablet  . levothyroxine (SYNTHROID) 112 MCG tablet  . metFORMIN (GLUCOPHAGE-XR) 500 MG 24 hr tablet  . pravastatin (PRAVACHOL) 20 MG tablet   No current facility-administered medications for this encounter.     Maia Plan [facility] Pre-Surgical Testing [phone] 05/10/18 11:39 AM

## 2018-05-10 NOTE — Anesthesia Preprocedure Evaluation (Addendum)
Anesthesia Evaluation  Patient identified by MRN, date of birth, ID band Patient awake    Reviewed: Allergy & Precautions, NPO status , Patient's Chart, lab work & pertinent test results  History of Anesthesia Complications (+) PONV and history of anesthetic complications  Airway Mallampati: II  TM Distance: >3 FB Neck ROM: Full    Dental no notable dental hx.    Pulmonary neg pulmonary ROS,    Pulmonary exam normal breath sounds clear to auscultation       Cardiovascular hypertension, Pt. on medications Normal cardiovascular exam Rhythm:Regular Rate:Normal  ECG: NSR, rate 65   Neuro/Psych negative neurological ROS  negative psych ROS   GI/Hepatic negative GI ROS, Neg liver ROS,   Endo/Other  diabetes, Oral Hypoglycemic AgentsHypothyroidism   Renal/GU negative Renal ROS     Musculoskeletal negative musculoskeletal ROS (+)   Abdominal   Peds  Hematology HLD   Anesthesia Other Findings CHRONIC CHOLECYSTITIS, CHOLELITHIASIS, ELEVATED LFTS  Reproductive/Obstetrics S/p hysterectomy                            Anesthesia Physical Anesthesia Plan  ASA: III  Anesthesia Plan: General   Post-op Pain Management:    Induction: Intravenous  PONV Risk Score and Plan: 4 or greater and Scopolamine patch - Pre-op, Midazolam, Dexamethasone, Ondansetron and Treatment may vary due to age or medical condition  Airway Management Planned: Oral ETT  Additional Equipment:   Intra-op Plan:   Post-operative Plan: Extubation in OR  Informed Consent: I have reviewed the patients History and Physical, chart, labs and discussed the procedure including the risks, benefits and alternatives for the proposed anesthesia with the patient or authorized representative who has indicated his/her understanding and acceptance.     Dental advisory given  Plan Discussed with: CRNA  Anesthesia Plan Comments:         Anesthesia Quick Evaluation

## 2018-05-11 ENCOUNTER — Encounter (HOSPITAL_COMMUNITY): Payer: Self-pay | Admitting: General Practice

## 2018-05-11 ENCOUNTER — Observation Stay (HOSPITAL_COMMUNITY)
Admission: RE | Admit: 2018-05-11 | Discharge: 2018-05-11 | Disposition: A | Discharge status: Home / Self Care | Payer: 59 | Attending: Surgery | Admitting: Surgery

## 2018-05-11 ENCOUNTER — Encounter (HOSPITAL_COMMUNITY)
Admission: RE | Disposition: A | Discharge status: Home / Self Care | Payer: Self-pay | Source: Home / Self Care | Attending: Surgery

## 2018-05-11 ENCOUNTER — Ambulatory Visit (HOSPITAL_COMMUNITY): Payer: 59 | Admitting: Certified Registered Nurse Anesthetist

## 2018-05-11 ENCOUNTER — Ambulatory Visit (HOSPITAL_COMMUNITY): Payer: 59

## 2018-05-11 ENCOUNTER — Other Ambulatory Visit: Payer: Self-pay

## 2018-05-11 ENCOUNTER — Ambulatory Visit (HOSPITAL_COMMUNITY): Payer: 59 | Admitting: Physician Assistant

## 2018-05-11 DIAGNOSIS — Z1159 Encounter for screening for other viral diseases: Secondary | ICD-10-CM | POA: Insufficient documentation

## 2018-05-11 DIAGNOSIS — R748 Abnormal levels of other serum enzymes: Secondary | ICD-10-CM | POA: Insufficient documentation

## 2018-05-11 DIAGNOSIS — E785 Hyperlipidemia, unspecified: Secondary | ICD-10-CM | POA: Diagnosis not present

## 2018-05-11 DIAGNOSIS — K8010 Calculus of gallbladder with chronic cholecystitis without obstruction: Secondary | ICD-10-CM | POA: Diagnosis not present

## 2018-05-11 DIAGNOSIS — I10 Essential (primary) hypertension: Secondary | ICD-10-CM | POA: Diagnosis not present

## 2018-05-11 DIAGNOSIS — Z7984 Long term (current) use of oral hypoglycemic drugs: Secondary | ICD-10-CM | POA: Insufficient documentation

## 2018-05-11 DIAGNOSIS — E119 Type 2 diabetes mellitus without complications: Secondary | ICD-10-CM | POA: Insufficient documentation

## 2018-05-11 HISTORY — DX: Hypothyroidism, unspecified: E03.9

## 2018-05-11 HISTORY — DX: Essential (primary) hypertension: I10

## 2018-05-11 HISTORY — DX: Pneumonia, unspecified organism: J18.9

## 2018-05-11 HISTORY — DX: Prediabetes: R73.03

## 2018-05-11 HISTORY — PX: CHOLECYSTECTOMY: SHX55

## 2018-05-11 LAB — GLUCOSE, CAPILLARY
Glucose-Capillary: 173 mg/dL — ABNORMAL HIGH (ref 70–99)
Glucose-Capillary: 214 mg/dL — ABNORMAL HIGH (ref 70–99)
Glucose-Capillary: 232 mg/dL — ABNORMAL HIGH (ref 70–99)

## 2018-05-11 LAB — NOVEL CORONAVIRUS, NAA (HOSP ORDER, SEND-OUT TO REF LAB; TAT 18-24 HRS): SARS-CoV-2, NAA: NOT DETECTED

## 2018-05-11 SURGERY — LAPAROSCOPIC CHOLECYSTECTOMY WITH INTRAOPERATIVE CHOLANGIOGRAM
Anesthesia: General | Site: Abdomen

## 2018-05-11 MED ORDER — CHLORHEXIDINE GLUCONATE CLOTH 2 % EX PADS: 6.0000 | MEDICATED_PAD | Freq: Once | CUTANEOUS | Status: DC

## 2018-05-11 MED ORDER — LEVOTHYROXINE SODIUM 112 MCG PO TABS: 112.0000 ug | ORAL_TABLET | Freq: Every day | ORAL | Status: DC

## 2018-05-11 MED ORDER — LABETALOL HCL 5 MG/ML IV SOLN
INTRAVENOUS | Status: DC | PRN
  Administered 2018-05-11 (×2): 5 mg via INTRAVENOUS

## 2018-05-11 MED ORDER — BUPIVACAINE-EPINEPHRINE 0.5% -1:200000 IJ SOLN
INTRAMUSCULAR | Status: AC
  Filled 2018-05-11: qty 1

## 2018-05-11 MED ORDER — SODIUM CHLORIDE 0.9 % IV SOLN: INTRAVENOUS | Status: DC | PRN

## 2018-05-11 MED ORDER — SUGAMMADEX SODIUM 200 MG/2ML IV SOLN
INTRAVENOUS | Status: DC | PRN
  Administered 2018-05-11: 200 mg via INTRAVENOUS

## 2018-05-11 MED ORDER — LABETALOL HCL 5 MG/ML IV SOLN
INTRAVENOUS | Status: AC
  Filled 2018-05-11: qty 4

## 2018-05-11 MED ORDER — BUPIVACAINE-EPINEPHRINE 0.5% -1:200000 IJ SOLN
INTRAMUSCULAR | Status: DC | PRN
  Administered 2018-05-11: 20 mL

## 2018-05-11 MED ORDER — KETOROLAC TROMETHAMINE 30 MG/ML IJ SOLN: 30.0000 mg | Freq: Once | INTRAMUSCULAR | Status: DC | PRN

## 2018-05-11 MED ORDER — ONDANSETRON HCL 4 MG/2ML IJ SOLN
INTRAMUSCULAR | Status: AC
  Filled 2018-05-11: qty 2

## 2018-05-11 MED ORDER — TRAMADOL HCL 50 MG PO TABS: 50.0000 mg | ORAL_TABLET | Freq: Four times a day (QID) | ORAL | Status: DC | PRN

## 2018-05-11 MED ORDER — 0.9 % SODIUM CHLORIDE (POUR BTL) OPTIME
TOPICAL | Status: DC | PRN
  Administered 2018-05-11: 09:00:00 1000 mL

## 2018-05-11 MED ORDER — MIDAZOLAM HCL 5 MG/5ML IJ SOLN
INTRAMUSCULAR | Status: DC | PRN
  Administered 2018-05-11: 2 mg via INTRAVENOUS

## 2018-05-11 MED ORDER — SUCCINYLCHOLINE CHLORIDE 200 MG/10ML IV SOSY
PREFILLED_SYRINGE | INTRAVENOUS | Status: AC
  Filled 2018-05-11: qty 10

## 2018-05-11 MED ORDER — LIDOCAINE 2% (20 MG/ML) 5 ML SYRINGE
INTRAMUSCULAR | Status: DC | PRN
  Administered 2018-05-11: 60 mg via INTRAVENOUS

## 2018-05-11 MED ORDER — MIDAZOLAM HCL 2 MG/2ML IJ SOLN
INTRAMUSCULAR | Status: AC
  Filled 2018-05-11: qty 2

## 2018-05-11 MED ORDER — ACETAMINOPHEN 325 MG PO TABS: 650.0000 mg | ORAL_TABLET | Freq: Four times a day (QID) | ORAL | Status: DC | PRN

## 2018-05-11 MED ORDER — METFORMIN HCL ER 500 MG PO TB24
500.0000 mg | ORAL_TABLET | Freq: Every day | ORAL | Status: DC
  Administered 2018-05-11: 14:00:00 500 mg via ORAL
  Filled 2018-05-11: qty 1

## 2018-05-11 MED ORDER — FENTANYL CITRATE (PF) 250 MCG/5ML IJ SOLN
INTRAMUSCULAR | Status: AC
  Filled 2018-05-11: qty 5

## 2018-05-11 MED ORDER — DEXAMETHASONE SODIUM PHOSPHATE 10 MG/ML IJ SOLN
INTRAMUSCULAR | Status: AC
  Filled 2018-05-11: qty 1

## 2018-05-11 MED ORDER — ONDANSETRON 4 MG PO TBDP: 4.0000 mg | ORAL_TABLET | Freq: Four times a day (QID) | ORAL | Status: DC | PRN

## 2018-05-11 MED ORDER — INSULIN ASPART 100 UNIT/ML ~~LOC~~ SOLN
3.0000 [IU] | Freq: Once | SUBCUTANEOUS | Status: AC
  Administered 2018-05-11: 3 [IU] via SUBCUTANEOUS

## 2018-05-11 MED ORDER — KCL IN DEXTROSE-NACL 20-5-0.45 MEQ/L-%-% IV SOLN
INTRAVENOUS | Status: DC
  Administered 2018-05-11: 13:00:00 via INTRAVENOUS
  Filled 2018-05-11: qty 1000

## 2018-05-11 MED ORDER — HYDROCODONE-ACETAMINOPHEN 5-325 MG PO TABS: 1.0000 | ORAL_TABLET | ORAL | 0 refills | Status: DC | PRN

## 2018-05-11 MED ORDER — CEFAZOLIN SODIUM-DEXTROSE 2-4 GM/100ML-% IV SOLN
2.0000 g | INTRAVENOUS | Status: AC
  Administered 2018-05-11: 2 g via INTRAVENOUS
  Filled 2018-05-11: qty 100

## 2018-05-11 MED ORDER — SCOPOLAMINE 1 MG/3DAYS TD PT72
MEDICATED_PATCH | TRANSDERMAL | Status: AC
  Filled 2018-05-11: qty 1

## 2018-05-11 MED ORDER — ROCURONIUM BROMIDE 10 MG/ML (PF) SYRINGE
PREFILLED_SYRINGE | INTRAVENOUS | Status: DC | PRN
  Administered 2018-05-11: 50 mg via INTRAVENOUS

## 2018-05-11 MED ORDER — FENTANYL CITRATE (PF) 100 MCG/2ML IJ SOLN
INTRAMUSCULAR | Status: DC | PRN
  Administered 2018-05-11 (×2): 100 ug via INTRAVENOUS

## 2018-05-11 MED ORDER — PROPOFOL 10 MG/ML IV BOLUS
INTRAVENOUS | Status: DC | PRN
  Administered 2018-05-11: 150 mg via INTRAVENOUS
  Administered 2018-05-11: 50 mg via INTRAVENOUS

## 2018-05-11 MED ORDER — ACETAMINOPHEN 650 MG RE SUPP: 650.0000 mg | Freq: Four times a day (QID) | RECTAL | Status: DC | PRN

## 2018-05-11 MED ORDER — ONDANSETRON HCL 4 MG/2ML IJ SOLN: 4.0000 mg | Freq: Four times a day (QID) | INTRAMUSCULAR | Status: DC | PRN

## 2018-05-11 MED ORDER — BUPIVACAINE HCL (PF) 0.5 % IJ SOLN
INTRAMUSCULAR | Status: AC
  Filled 2018-05-11: qty 30

## 2018-05-11 MED ORDER — INSULIN ASPART 100 UNIT/ML ~~LOC~~ SOLN
SUBCUTANEOUS | Status: AC
  Filled 2018-05-11: qty 1

## 2018-05-11 MED ORDER — HYDROMORPHONE HCL 1 MG/ML IJ SOLN
0.2500 mg | INTRAMUSCULAR | Status: DC | PRN
  Administered 2018-05-11: 10:00:00 0.5 mg via INTRAVENOUS

## 2018-05-11 MED ORDER — ACETAMINOPHEN 500 MG PO TABS
ORAL_TABLET | ORAL | Status: AC
  Filled 2018-05-11: qty 2

## 2018-05-11 MED ORDER — SUGAMMADEX SODIUM 200 MG/2ML IV SOLN
INTRAVENOUS | Status: AC
  Filled 2018-05-11: qty 2

## 2018-05-11 MED ORDER — BUPIVACAINE-EPINEPHRINE (PF) 0.5% -1:200000 IJ SOLN
INTRAMUSCULAR | Status: AC
  Filled 2018-05-11: qty 1.8

## 2018-05-11 MED ORDER — LACTATED RINGERS IR SOLN
Status: DC | PRN
  Administered 2018-05-11: 1000 mL

## 2018-05-11 MED ORDER — ACETAMINOPHEN 500 MG PO TABS
1000.0000 mg | ORAL_TABLET | Freq: Once | ORAL | Status: AC
  Administered 2018-05-11: 07:00:00 1000 mg via ORAL
  Filled 2018-05-11: qty 2

## 2018-05-11 MED ORDER — HYDROMORPHONE HCL 1 MG/ML IJ SOLN: 1.0000 mg | INTRAMUSCULAR | Status: DC | PRN

## 2018-05-11 MED ORDER — SCOPOLAMINE 1 MG/3DAYS TD PT72
MEDICATED_PATCH | TRANSDERMAL | Status: DC | PRN
  Administered 2018-05-11: 1 via TRANSDERMAL

## 2018-05-11 MED ORDER — HYDROCODONE-ACETAMINOPHEN 5-325 MG PO TABS
1.0000 | ORAL_TABLET | ORAL | Status: DC | PRN
  Administered 2018-05-11: 1 via ORAL
  Filled 2018-05-11: qty 1

## 2018-05-11 MED ORDER — PROMETHAZINE HCL 25 MG/ML IJ SOLN: 6.2500 mg | INTRAMUSCULAR | Status: DC | PRN

## 2018-05-11 MED ORDER — PROPOFOL 10 MG/ML IV BOLUS
INTRAVENOUS | Status: AC
  Filled 2018-05-11: qty 20

## 2018-05-11 MED ORDER — ROCURONIUM BROMIDE 10 MG/ML (PF) SYRINGE
PREFILLED_SYRINGE | INTRAVENOUS | Status: AC
  Filled 2018-05-11: qty 10

## 2018-05-11 MED ORDER — HYDROMORPHONE HCL 1 MG/ML IJ SOLN
INTRAMUSCULAR | Status: AC
  Filled 2018-05-11: qty 1

## 2018-05-11 MED ORDER — LACTATED RINGERS IV SOLN
INTRAVENOUS | Status: DC
  Administered 2018-05-11 (×2): via INTRAVENOUS

## 2018-05-11 MED ORDER — OXYCODONE HCL 5 MG/5ML PO SOLN: 5.0000 mg | Freq: Once | ORAL | Status: DC | PRN

## 2018-05-11 MED ORDER — SUCCINYLCHOLINE CHLORIDE 200 MG/10ML IV SOSY
PREFILLED_SYRINGE | INTRAVENOUS | Status: DC | PRN
  Administered 2018-05-11: 100 mg via INTRAVENOUS

## 2018-05-11 MED ORDER — AMLODIPINE BESYLATE 5 MG PO TABS: 5.0000 mg | ORAL_TABLET | Freq: Every day | ORAL | Status: DC

## 2018-05-11 MED ORDER — LIDOCAINE 2% (20 MG/ML) 5 ML SYRINGE
INTRAMUSCULAR | Status: AC
  Filled 2018-05-11: qty 5

## 2018-05-11 MED ORDER — IOHEXOL 300 MG/ML  SOLN
INTRAMUSCULAR | Status: DC | PRN
  Administered 2018-05-11: 09:00:00 7 mL via INTRAVENOUS

## 2018-05-11 MED ORDER — OXYCODONE HCL 5 MG PO TABS: 5.0000 mg | ORAL_TABLET | Freq: Once | ORAL | Status: DC | PRN

## 2018-05-11 MED ORDER — DEXAMETHASONE SODIUM PHOSPHATE 4 MG/ML IJ SOLN
INTRAMUSCULAR | Status: DC | PRN
  Administered 2018-05-11: 5 mg via INTRAVENOUS

## 2018-05-11 SURGICAL SUPPLY — 34 items
ADH SKN CLS APL DERMABOND .7 (GAUZE/BANDAGES/DRESSINGS) ×1
APPLIER CLIP ROT 10 11.4 M/L (STAPLE) ×2
CABLE HIGH FREQUENCY MONO STRZ (ELECTRODE) ×2 IMPLANT
CHLORAPREP W/TINT 26 (MISCELLANEOUS) ×2 IMPLANT
CLIP APPLIE ROT 10 11.4 M/L (STAPLE) ×1 IMPLANT
COVER MAYO STAND STRL (DRAPES) ×2 IMPLANT
COVER SURGICAL LIGHT HANDLE (MISCELLANEOUS) ×2 IMPLANT
COVER WAND RF STERILE (DRAPES) ×2 IMPLANT
DECANTER SPIKE VIAL GLASS SM (MISCELLANEOUS) ×2 IMPLANT
DERMABOND ADVANCED (GAUZE/BANDAGES/DRESSINGS) ×1
DERMABOND ADVANCED .7 DNX12 (GAUZE/BANDAGES/DRESSINGS) ×1 IMPLANT
DRAPE C-ARM 42X120 X-RAY (DRAPES) ×2 IMPLANT
ELECT REM PT RETURN 15FT ADLT (MISCELLANEOUS) ×2 IMPLANT
GAUZE SPONGE 2X2 8PLY STRL LF (GAUZE/BANDAGES/DRESSINGS) ×1 IMPLANT
GLOVE SURG ORTHO 8.0 STRL STRW (GLOVE) ×2 IMPLANT
GOWN STRL REUS W/TWL XL LVL3 (GOWN DISPOSABLE) ×4 IMPLANT
HEMOSTAT SURGICEL 4X8 (HEMOSTASIS) IMPLANT
KIT BASIN OR (CUSTOM PROCEDURE TRAY) ×2 IMPLANT
KIT TURNOVER KIT A (KITS) IMPLANT
POUCH SPECIMEN RETRIEVAL 10MM (ENDOMECHANICALS) ×2 IMPLANT
SCISSORS LAP 5X35 DISP (ENDOMECHANICALS) ×2 IMPLANT
SET CHOLANGIOGRAPH MIX (MISCELLANEOUS) ×2 IMPLANT
SET IRRIG TUBING LAPAROSCOPIC (IRRIGATION / IRRIGATOR) ×2 IMPLANT
SET TUBE SMOKE EVAC HIGH FLOW (TUBING) IMPLANT
SLEEVE XCEL OPT CAN 5 100 (ENDOMECHANICALS) ×2 IMPLANT
SPONGE GAUZE 2X2 STER 10/PKG (GAUZE/BANDAGES/DRESSINGS) ×1
STRIP CLOSURE SKIN 1/2X4 (GAUZE/BANDAGES/DRESSINGS) ×2 IMPLANT
SUT MNCRL AB 4-0 PS2 18 (SUTURE) ×2 IMPLANT
TOWEL OR 17X26 10 PK STRL BLUE (TOWEL DISPOSABLE) ×2 IMPLANT
TOWEL OR NON WOVEN STRL DISP B (DISPOSABLE) ×2 IMPLANT
TRAY LAPAROSCOPIC (CUSTOM PROCEDURE TRAY) ×2 IMPLANT
TROCAR BLADELESS OPT 5 100 (ENDOMECHANICALS) ×2 IMPLANT
TROCAR XCEL BLUNT TIP 100MML (ENDOMECHANICALS) ×2 IMPLANT
TROCAR XCEL NON-BLD 11X100MML (ENDOMECHANICALS) ×2 IMPLANT

## 2018-05-11 NOTE — Anesthesia Procedure Notes (Signed)
Procedure Name: Intubation Date/Time: 05/11/2018 7:38 AM Performed by: Claudia Desanctis, CRNA Pre-anesthesia Checklist: Patient identified, Emergency Drugs available, Suction available and Patient being monitored Patient Re-evaluated:Patient Re-evaluated prior to induction Oxygen Delivery Method: Circle system utilized Preoxygenation: Pre-oxygenation with 100% oxygen Induction Type: IV induction Laryngoscope Size: 2 and Miller Grade View: Grade II Tube type: Oral Tube size: 7.0 mm Number of attempts: 1 Airway Equipment and Method: Stylet Placement Confirmation: ETT inserted through vocal cords under direct vision,  positive ETCO2 and breath sounds checked- equal and bilateral Secured at: 22 cm Tube secured with: Tape Dental Injury: Teeth and Oropharynx as per pre-operative assessment  Comments: Limited neck movement

## 2018-05-11 NOTE — Op Note (Signed)
Procedure Note  Pre-operative Diagnosis:  Chronic cholecystitis, cholelithiasis, elevated liver enzymes  Post-operative Diagnosis:  same  Surgeon:  Armandina Gemma, MD  Assistant:  Johnathan Hausen, MD   Procedure:  Laparoscopic cholecystectomy with intra-operative cholangiography  Anesthesia:  General  Estimated Blood Loss:  minimal  Drains: none         Specimen: gallbladder to pathology  Indications:  Patient is referred by Dr. Leanna Battles and by the staff in the emergency department for evaluation of symptomatic cholelithiasis, chronic cholecystitis, and elevated liver function test. Hand had intermittent epigastric abdominal pain for approximately 6 months. Last week she experienced 3 consecutive days of abdominal pain. This was relatively persistent. She was seen in the physician's office and noted to have elevated liver function test. She was referred to the emergency department where she had repeat laboratory studies and underwent an ultrasound examination of the abdomen on April 27, 2018. This demonstrated a large shadowing gallstone measuring 2.7 cm. Biliary tree was not dilated. There was no pericholecystic fluid. Patient was discharged home taking ibuprofen for pain. Laboratory studies at her emergency department visit demonstrated elevated transaminases of 287 and 385 and an elevated alkaline phosphatase of 170. Total bilirubin was normal at 0.8. She has not had repeat liver function test in the past week. Patient has noted resolution of her pain. Prior abdominal surgery includes appendectomy and hysterectomy. There is no family history of gallbladder disease. Patient denies any history of hepatobiliary or pancreatic disease.  Procedure Details:  The patient was seen in the pre-op holding area. The risks, benefits, complications, treatment options, and expected outcomes were previously discussed with the patient. The patient agreed with the proposed plan and has  signed the informed consent form.  The patient was transported to operating room # 2 at the [facility]. The patient was placed in the supine position on the operating room table. Following induction of general anesthesia, the abdomen was prepped and draped in the usual aseptic fashion.  An incision was made in the skin near the umbilicus. The midline fascia was incised and the peritoneal cavity was entered and a Hasson cannula was introduced under direct vision. The cannula was secured with a 0-Vicryl pursestring suture. Pneumoperitoneum was established with carbon dioxide. Additional cannulae were introduced under direct vision along the right costal margin in the midline, mid-clavicular line, and anterior axillary line.   The gallbladder was identified and the fundus grasped and retracted cephalad. Adhesions were taken down bluntly and the electrocautery was utilized as needed, taking care not to involve any adjacent structures. The infundibulum was grasped and retracted laterally, exposing the peritoneum overlying the triangle of Calot. The peritoneum was incised and structures exposed with blunt dissection. The cystic duct was clearly identified, bluntly dissected circumferentially, and clipped at the neck of the gallbladder.  An incision was made in the cystic duct and the cholangiogram catheter introduced. The catheter was secured using an ligaclip.  Real-time cholangiography was performed using C-arm fluoroscopy.  There was rapid filling of a normal caliber common bile duct.  There was reflux of contrast into the left and right hepatic ductal systems.  There was free flow distally into the duodenum without filling defect or obstruction.  The catheter was removed from the peritoneal cavity.  The cystic duct was then ligated with ligaclips and divided. The cystic artery was identified, dissected circumferentially, ligated with ligaclips, and divided.  The gallbladder was dissected away  from the gallbladder bed using the electrocautery for hemostasis.  The gallbladder was completely removed from the liver and placed into an endocatch bag. The gallbladder was removed in the endocatch bag through the umbilical port site and submitted to pathology for review.  The right upper quadrant was irrigated and the gallbladder bed was inspected. Hemostasis was achieved with the electrocautery.  Cannulae were removed under direct vision and good hemostasis was noted. Pneumoperitoneum was released and the majority of the carbon dioxide evacuated. The umbilical wound was irrigated and the fascia was then closed with the pursestring suture.  Local anesthetic was infiltrated at all port sites. Skin incisions were closed with 4-0 Monocril subcuticular sutures and Dermabond was applied.  Instrument, sponge, and needle counts were correct at the conclusion of the case.  The patient was awakened from anesthesia and brought to the recovery room in stable condition.  The patient tolerated the procedure well.   Armandina Gemma, MD [facility] Office: [phone]

## 2018-05-11 NOTE — Transfer of Care (Signed)
Immediate Anesthesia Transfer of Care Note  Patient: Stacie [name]  Procedure(s) Performed: LAPAROSCOPIC CHOLECYSTECTOMY WITH INTRAOPERATIVE CHOLANGIOGRAM (N/A Abdomen)  Patient Location: PACU  Anesthesia Type:General  Level of Consciousness: awake and patient cooperative  Airway & Oxygen Therapy: Patient Spontanous Breathing and Patient connected to face mask  Post-op Assessment: Report given to RN and Post -op Vital signs reviewed and stable  Post vital signs: Reviewed and stable  Last Vitals:  Vitals Value Taken Time  BP 141/85 05/11/2018  8:55 AM  Temp    Pulse 79 05/11/2018  8:56 AM  Resp 21 05/11/2018  8:56 AM  SpO2 94 % 05/11/2018  8:56 AM  Vitals shown include unvalidated device data.  Last Pain:  Vitals:   05/11/18 0601  TempSrc: Oral  PainSc:          Complications: No apparent anesthesia complications

## 2018-05-11 NOTE — Interval H&P Note (Signed)
History and Physical Interval Note:  05/11/2018 7:16 AM  [name]  has presented today for surgery, with the diagnosis of CHRONIC CHOLECYSTITIS, CHOLELITHIASIS, ELEVATED LFTS.  The various methods of treatment have been discussed with the patient and family. After consideration of risks, benefits and other options for treatment, the patient has consented to  Procedure(s): LAPAROSCOPIC CHOLECYSTECTOMY WITH INTRAOPERATIVE CHOLANGIOGRAM (N/A) as a surgical intervention.  The patient's history has been reviewed, patient examined, no change in status, stable for surgery.  I have reviewed the patient's chart and labs.  Questions were answered to the patient's satisfaction.     Stacie [name]

## 2018-05-11 NOTE — Anesthesia Postprocedure Evaluation (Signed)
Anesthesia Post Note  Patient: Stacie [name]  Procedure(s) Performed: LAPAROSCOPIC CHOLECYSTECTOMY WITH INTRAOPERATIVE CHOLANGIOGRAM (N/A Abdomen)     Patient location during evaluation: PACU Anesthesia Type: General Level of consciousness: awake and alert Pain management: pain level controlled Vital Signs Assessment: post-procedure vital signs reviewed and stable Respiratory status: spontaneous breathing, nonlabored ventilation, respiratory function stable and patient connected to nasal cannula oxygen Cardiovascular status: blood pressure returned to baseline and stable Postop Assessment: no apparent nausea or vomiting Anesthetic complications: no    Last Vitals:  Vitals:   05/11/18 1326 05/11/18 1429  BP: 130/76 129/79  Pulse: 86 88  Resp: 16 16  Temp: 36.8 C 36.8 C  SpO2: 95% 93%    Last Pain:  Vitals:   05/11/18 1429  TempSrc: Oral  PainSc:                  Balbina Depace P Rache Klimaszewski

## 2018-05-11 NOTE — Discharge Instructions (Signed)
[facility]  LAPAROSCOPIC SURGERY:  POST-OP INSTRUCTIONS  Always review your discharge instruction sheet given to you by the facility where your surgery was performed.  A prescription for pain medication may be given to you upon discharge.  Take your pain medication as prescribed.  If narcotic pain medicine is not needed, then you may take acetaminophen (Tylenol) or ibuprofen (Advil) as needed.  Take your usually prescribed medications unless otherwise directed.  If you need a refill on your pain medication, please contact your pharmacy.  They will contact our office to request authorization. Prescriptions will not be filled after 5 P.M. or on weekends.  You should follow a light diet the first few days after arrival home, such as soup and crackers or toast.  Be sure to include plenty of fluids daily.  Most patients will experience some swelling and bruising in the area of the incisions.  Ice packs will help.  Swelling and bruising can take several days to resolve.   It is common to experience some constipation after surgery.  Increasing fluid intake and taking a stool softener (such as Colace) will usually help or prevent this problem from occurring.  A mild laxative (Milk of Magnesia or Miralax) should be taken according to package instructions if there has been no bowel movement after 48 hours.  You will likely have Dermabond (topical glue) over your incisions.  This seals the incisions and allows you to bathe and shower at any time after your surgery.  Glue should remain in place for up to 10 days.  It may be removed after 10 days by pealing off the Dermabond material or using Vaseline or naval jelly to remove.  If you have steri-strips over your incisions, you may remove the gauze bandage on the second day after surgery, and you may shower at that time.  Leave your steri-strips (small skin tapes) in place directly over the incision.  These strips should remain on the  skin for 5-7 days and then be removed.  You may get them wet in the shower and pat them dry.  Any sutures or staples will be removed at the office during your follow-up visit.  ACTIVITIES:  You may resume regular (light) daily activities beginning the next day - such as daily self-care, walking, climbing stairs - gradually increasing activities as tolerated.  You may have sexual intercourse when it is comfortable.  Refrain from any heavy lifting or straining until approved by your doctor.  You may drive when you are no longer taking prescription pain medication, when you can comfortably wear a seatbelt, and when you can safely maneuver your car and apply brakes.  You should see your doctor in the office for a follow-up appointment approximately 2-3 weeks after your surgery.  Make sure that you call for this appointment within a day or two after you arrive home to insure a convenient appointment time.  WHEN TO CALL YOUR DOCTOR: 1. Fever over 101.0 2. Inability to urinate 3. Continued bleeding from incision 4. Increased pain, redness, or drainage from the incision 5. Increasing abdominal pain  The clinic staff is available to answer your questions during regular business hours.  Please don't hesitate to call and ask to speak to one of the nurses for clinical concerns.  If you have a medical emergency, go to the nearest emergency room or call 911.  A surgeon from [facility] is always on call for the hospital.  Taydon Nasworthy M. Makyle Eslick, MD, FACS [facility] Office: [phone] Toll Free:  [phone] FAX [phone]  Website: [email or web address] 

## 2018-05-12 ENCOUNTER — Encounter (HOSPITAL_COMMUNITY): Payer: Self-pay | Admitting: Surgery

## 2018-05-14 NOTE — Discharge Summary (Signed)
Physician Discharge Summary [facility]  Patient ID: Stacie [name] MRN: [id number] DOB/AGE: [birth year] 59 y.o.  Admit date: 05/11/2018 Discharge date: 05/11/2018  Admission Diagnoses:  Cholecystitis with cholelithiasis, elevated liver enzymes  Discharge Diagnoses:  Principal Problem:   Cholelithiasis with cholecystitis Active Problems:   Cholelithiasis with chronic cholecystitis   Discharged Condition: good  Hospital Course: Patient was admitted for observation following gallbladder surgery.  Post op course was uncomplicated.  Pain was well controlled.  Tolerated diet.  Patient was prepared for discharge home on the afternoon of surgery.  Consults: None  Treatments: surgery: lap chole with IOC  Discharge Exam: Blood pressure 129/79, pulse 88, temperature 98.2 F (36.8 C), temperature source Oral, resp. rate 16, height 5\' 4"  (1.626 m), weight 78.7 kg, SpO2 93 %. See progress notes.   Disposition: Home  Discharge Instructions    Diet - low sodium heart healthy   Complete by:  As directed    Discharge instructions   Complete by:  As directed    [facility]  LAPAROSCOPIC SURGERY:  POST-OP INSTRUCTIONS  Always review your discharge instruction sheet given to you by the facility where your surgery was performed.  A prescription for pain medication may be given to you upon discharge.  Take your pain medication as prescribed.  If narcotic pain medicine is not needed, then you may take acetaminophen (Tylenol) or ibuprofen (Advil) as needed.  Take your usually prescribed medications unless otherwise directed.  If you need a refill on your pain medication, please contact your pharmacy.  They will contact our office to request authorization. Prescriptions will not be filled after 5 P.M. or on weekends.  You should follow a light diet the first few days after arrival home, such as soup and crackers or toast.  Be sure to include plenty  of fluids daily.  Most patients will experience some swelling and bruising in the area of the incisions.  Ice packs will help.  Swelling and bruising can take several days to resolve.   It is common to experience some constipation after surgery.  Increasing fluid intake and taking a stool softener (such as Colace) will usually help or prevent this problem from occurring.  A mild laxative (Milk of Magnesia or Miralax) should be taken according to package instructions if there has been no bowel movement after 48 hours.  You will likely have Dermabond (topical glue) over your incisions.  This seals the incisions and allows you to bathe and shower at any time after your surgery.  Glue should remain in place for up to 10 days.  It may be removed after 10 days by pealing off the Dermabond material or using Vaseline or naval jelly to remove.  If you have steri-strips over your incisions, you may remove the gauze bandage on the second day after surgery, and you may shower at that time.  Leave your steri-strips (small skin tapes) in place directly over the incision.  These strips should remain on the skin for 5-7 days and then be removed.  You may get them wet in the shower and pat them dry.  Any sutures or staples will be removed at the office during your follow-up visit.  ACTIVITIES:  You may resume regular (light) daily activities beginning the next day - such as daily self-care, walking, climbing stairs - gradually increasing activities as tolerated.  You may have sexual intercourse when it is comfortable.  Refrain from any heavy lifting or  straining until approved by your doctor.  You may drive when you are no longer taking prescription pain medication, when you can comfortably wear a seatbelt, and when you can safely maneuver your car and apply brakes.  You should see your doctor in the office for a follow-up appointment approximately 2-3 weeks after your surgery.  Make sure that you call for this  appointment within a day or two after you arrive home to insure a convenient appointment time.  WHEN TO CALL YOUR DOCTOR: Fever over 101.0 Inability to urinate Continued bleeding from incision Increased pain, redness, or drainage from the incision Increasing abdominal pain  The clinic staff is available to answer your questions during regular business hours.  Please don't hesitate to call and ask to speak to one of the nurses for clinical concerns.  If you have a medical emergency, go to the nearest emergency room or call 911.  A surgeon from [facility] is always on call for the hospital.  Earnstine Regal, MD, [facility] Office: [place] Free:  [place] [phone]  Website: [email or web address]   Increase activity slowly   Complete by:  As directed    No dressing needed   Complete by:  As directed      Allergies as of 05/11/2018      Reactions   Darvocet [propoxyphene N-acetaminophen] Other (See Comments)   Patient states this medication gives her a severe headache      Medication List    TAKE these medications   amLODipine 5 MG tablet Commonly known as:  NORVASC Take 5 mg by mouth daily.   HYDROcodone-acetaminophen 5-325 MG tablet Commonly known as:  NORCO/VICODIN Take 1-2 tablets by mouth every 4 (four) hours as needed for moderate pain.   HYDROcodone-acetaminophen 5-325 MG tablet Commonly known as:  NORCO/VICODIN Take 1-2 tablets by mouth every 4 (four) hours as needed for moderate pain.   ibuprofen 200 MG tablet Commonly known as:  ADVIL Take 200 mg by mouth every 6 (six) hours as needed (pain).   levothyroxine 112 MCG tablet Commonly known as:  SYNTHROID Take 112 mcg by mouth daily before breakfast. NAME BRAND ONLY   metFORMIN 500 MG 24 hr tablet Commonly known as:  GLUCOPHAGE-XR Take 500 mg by mouth daily.   pravastatin 20 MG tablet Commonly known as:  PRAVACHOL Take 20 mg by mouth  every morning.      Follow-up Information    Armandina Gemma, MD. Schedule an appointment as soon as possible for a visit in 3 weeks.   Specialty:  General Surgery Why:  For wound re-check Contact information: [place] [address] [phone]           Earnstine Regal, MD, [facility] Office: [phone]   Signed: Armandina Gemma 05/14/2018, 10:27 AM

## 2018-09-12 ENCOUNTER — Other Ambulatory Visit: Payer: Self-pay | Admitting: Internal Medicine

## 2018-09-12 DIAGNOSIS — N6001 Solitary cyst of right breast: Secondary | ICD-10-CM

## 2018-09-20 ENCOUNTER — Ambulatory Visit (INDEPENDENT_AMBULATORY_CARE_PROVIDER_SITE_OTHER): Payer: 59

## 2018-09-20 ENCOUNTER — Other Ambulatory Visit: Payer: Self-pay

## 2018-09-20 ENCOUNTER — Ambulatory Visit (INDEPENDENT_AMBULATORY_CARE_PROVIDER_SITE_OTHER): Payer: 59 | Admitting: Podiatry

## 2018-09-20 ENCOUNTER — Encounter: Payer: Self-pay | Admitting: Podiatry

## 2018-09-20 DIAGNOSIS — M2012 Hallux valgus (acquired), left foot: Secondary | ICD-10-CM

## 2018-09-20 DIAGNOSIS — M2011 Hallux valgus (acquired), right foot: Secondary | ICD-10-CM | POA: Diagnosis not present

## 2018-09-20 NOTE — Patient Instructions (Signed)
Bunion  A bunion is a bump on the base of the big toe that forms when the bones of the big toe joint move out of position. Bunions may be small at first, but they often get larger over time. They can make walking painful. What are the causes? A bunion may be caused by:  Wearing narrow or pointed shoes that force the big toe to press against the other toes.  Abnormal foot development that causes the foot to roll inward (pronate).  Changes in the foot that are caused by certain diseases, such as rheumatoid arthritis or polio.  A foot injury. What increases the risk? The following factors may make you more likely to develop this condition:  Wearing shoes that squeeze the toes together.  Having certain diseases, such as: ? Rheumatoid arthritis. ? Polio. ? Cerebral palsy.  Having family members who have bunions.  Being born with a foot deformity, such as flat feet or low arches.  Doing activities that put a lot of pressure on the feet, such as ballet dancing. What are the signs or symptoms? The main symptom of a bunion is a noticeable bump on the big toe. Other symptoms may include:  Pain.  Swelling around the big toe.  Redness and inflammation.  Thick or hardened skin on the big toe or between the toes.  Stiffness or loss of motion in the big toe.  Trouble with walking. How is this diagnosed? A bunion may be diagnosed based on your symptoms, medical history, and activities. You may have tests, such as:  X-rays. These allow your health care provider to check the position of the bones in your foot and look for damage to your joint. They also help your health care provider determine the severity of your bunion and the best way to treat it.  Joint aspiration. In this test, a sample of fluid is removed from the toe joint. This test may be done if you are in a lot of pain. It helps rule out diseases that cause painful swelling of the joints, such as arthritis. How is this  treated? Treatment depends on the severity of your symptoms. The goal of treatment is to relieve symptoms and prevent the bunion from getting worse. Your health care provider may recommend:  Wearing shoes that have a wide toe box.  Using bunion pads to cushion the affected area.  Taping your toes together to keep them in a normal position.  Placing a device inside your shoe (orthotics) to help reduce pressure on your toe joint.  Taking medicine to ease pain, inflammation, and swelling.  Applying heat or ice to the affected area.  Doing stretching exercises.  Surgery to remove scar tissue and move the toes back into their normal position. This treatment is rare. Follow these instructions at home: Managing pain, stiffness, and swelling   If directed, put ice on the painful area: ? Put ice in a plastic bag. ? Place a towel between your skin and the bag. ? Leave the ice on for 20 minutes, 2-3 times a day. Activity   If directed, apply heat to the affected area before you exercise. Use the heat source that your health care provider recommends, such as a moist heat pack or a heating pad. ? Place a towel between your skin and the heat source. ? Leave the heat on for 20-30 minutes. ? Remove the heat if your skin turns bright red. This is especially important if you are unable to feel pain,   heat, or cold. You may have a greater risk of getting burned.  Do exercises as told by your health care provider. General instructions  Support your toe joint with proper footwear, shoe padding, or taping as told by your health care provider.  Take over-the-counter and prescription medicines only as told by your health care provider.  Keep all follow-up visits as told by your health care provider. This is important. Contact a health care provider if your symptoms:  Get worse.  Do not improve in 2 weeks. Get help right away if you have:  Severe pain and trouble with walking. Summary  A  bunion is a bump on the base of the big toe that forms when the bones of the big toe joint move out of position.  Bunions can make walking painful.  Treatment depends on the severity of your symptoms.  Support your toe joint with proper footwear, shoe padding, or taping as told by your health care provider. This information is not intended to replace advice given to you by your health care provider. Make sure you discuss any questions you have with your health care provider. Document Released: 12/20/2004 Document Revised: 06/26/2017 Document Reviewed: 05/02/2017 Elsevier Patient Education  2020 [employer]  

## 2018-09-20 NOTE — Progress Notes (Signed)
   Subjective:    Patient ID: Stacie [name], female    DOB: September 02, 1960, 58 y.o.   MRN: [id number]  HPI    Review of Systems  All other systems reviewed and are negative.      Objective:   Physical Exam        Assessment & Plan:

## 2018-09-24 ENCOUNTER — Other Ambulatory Visit: Payer: Self-pay

## 2018-09-24 ENCOUNTER — Ambulatory Visit (INDEPENDENT_AMBULATORY_CARE_PROVIDER_SITE_OTHER): Payer: 59 | Admitting: Podiatry

## 2018-09-24 ENCOUNTER — Encounter: Payer: Self-pay | Admitting: Podiatry

## 2018-09-24 DIAGNOSIS — M2012 Hallux valgus (acquired), left foot: Secondary | ICD-10-CM

## 2018-09-24 DIAGNOSIS — M2011 Hallux valgus (acquired), right foot: Secondary | ICD-10-CM

## 2018-09-24 NOTE — Patient Instructions (Signed)
Pre-Operative Instructions  Congratulations, you have decided to take an important step towards improving your quality of life.  You can be assured that the doctors and staff at [facility] will be with you every step of the way.  Here are some important things you should know:  1. Plan to be at the surgery center/hospital at least 1 (one) hour prior to your scheduled time, unless otherwise directed by the surgical center/hospital staff.  You must have a responsible adult accompany you, remain during the surgery and drive you home.  Make sure you have directions to the surgical center/hospital to ensure you arrive on time. 2. If you are having surgery at [facility] or [facility], you will need a copy of your medical history and physical form from your family physician within one month prior to the date of surgery. We will give you a form for your primary physician to complete.  3. We make every effort to accommodate the date you request for surgery.  However, there are times where surgery dates or times have to be moved.  We will contact you as soon as possible if a change in schedule is required.   4. No aspirin/ibuprofen for one week before surgery.  If you are on aspirin, any non-steroidal anti-inflammatory medications (Mobic, Aleve, Ibuprofen) should not be taken seven (7) days prior to your surgery.  You make take Tylenol for pain prior to surgery.  5. Medications - If you are taking daily heart and blood pressure medications, seizure, reflux, allergy, asthma, anxiety, pain or diabetes medications, make sure you notify the surgery center/hospital before the day of surgery so they can tell you which medications you should take or avoid the day of surgery. 6. No food or drink after midnight the night before surgery unless directed otherwise by surgical center/hospital staff. 7. No alcoholic beverages 24-hours prior to surgery.  No smoking 24-hours prior or 24-hours after  surgery. 8. Wear loose pants or shorts. They should be loose enough to fit over bandages, boots, and casts. 9. Don't wear slip-on shoes. Sneakers are preferred. 10. Bring your boot with you to the surgery center/hospital.  Also bring crutches or a walker if your physician has prescribed it for you.  If you do not have this equipment, it will be provided for you after surgery. 11. If you have not been contacted by the surgery center/hospital by the day before your surgery, call to confirm the date and time of your surgery. 12. Leave-time from work may vary depending on the type of surgery you have.  Appropriate arrangements should be made prior to surgery with your employer. 13. Prescriptions will be provided immediately following surgery by your doctor.  Fill these as soon as possible after surgery and take the medication as directed. Pain medications will not be refilled on weekends and must be approved by the doctor. 14. Remove nail polish on the operative foot and avoid getting pedicures prior to surgery. 15. Wash the night before surgery.  The night before surgery wash the foot and leg well with water and the antibacterial soap provided. Be sure to pay special attention to beneath the toenails and in between the toes.  Wash for at least three (3) minutes. Rinse thoroughly with water and dry well with a towel.  Perform this wash unless told not to do so by your physician.  Enclosed: 1 Ice pack (please put in freezer the night before surgery)   1 Hibiclens skin cleaner     Pre-op instructions  If you have any questions regarding the instructions, please do not hesitate to call our office.  [place]: [address], [place], [place] [address] -- [phone]  [place]: [address] , [place] [address] -- [phone]  [place]: [address]  , [place] [address] -- [phone]   Website: [email or web address] 

## 2018-09-24 NOTE — Progress Notes (Signed)
Subjective:   Patient ID: Stacie [name], female   DOB: 58 y.o.   MRN: [id number]   HPI Patient presents stating I have a very painful bunion left and I know I need to get it corrected and patient states that the right also bothers her but not to the same degree and she is tried wider shoes and soaks.  Patient states the pain is worsened over the last year   Review of Systems  All other systems reviewed and are negative.       Objective:  Physical Exam Vitals signs and nursing note reviewed.  Constitutional:      Appearance: She is well-developed.  Pulmonary:     Effort: Pulmonary effort is normal.  Musculoskeletal: Normal range of motion.  Skin:    General: Skin is warm.  Neurological:     Mental Status: She is alert.     Neurovascular status intact muscle strength found to be adequate range of motion within normal limits with exquisite discomfort first metatarsal left over right with redness around the first metatarsal head and no range of motion loss currently pain.  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  Due to the longstanding nature patient is opted for surgical intervention and I do think would be in her best interest and I reviewed what would be required and discussed at great length surgical intervention and she is scheduled for consult and a date is given for the procedure.  Patient would like to get both feet fixed by the end of the year  X-rays indicate that there is large structural bunion deformity noted bilateral with moderate deviation of the hallux bilateral

## 2018-10-01 NOTE — Progress Notes (Signed)
Subjective:   Patient ID: Stacie [name], female   DOB: 58 y.o.   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  Patient is willing to accept risk wants surgery and signed consent form after extensive review and patient is scheduled for outpatient surgery.  I then went ahead and reviewed recovery and the fact total recovery can take 6 months to 1 year and dispensed air fracture walker with all instructions on usage and encouraged her to call us with questions concerns

## 2018-10-15 ENCOUNTER — Telehealth: Payer: Self-pay | Admitting: *Deleted

## 2018-10-15 NOTE — Telephone Encounter (Addendum)
DOS 10/16/2018 [place] BUNIONECTOM[facility] LEFT FOOT - [place]: Eligibility Date - 01/03/2018 - 01/03/2019  Plan Deductible Per Calendar Year $2,800.00 of $2,800.00 Met  Out-of-Pocket Maximum Per Calendar Year $6,900.00 of $6,900.00 Met  70% of eligible expenses after satisfying the deductible.    Pre-certification is REQUIRED. Authorization # [id number]     COVERAGE STATUS  Overall Coverage Status   Covered/Approved    1-1 Code  Description  Coverage Status DECISION DATE    [facility]  Coverage determination is reflected for the facility admission and is not a guarantee of payment for ongoing services. Covered/Approved 10/05/2018  1 [id number] Correction, hallux valgus (bunionectomy) more  Covered/Approved 10/05/2018

## 2018-10-16 ENCOUNTER — Encounter: Payer: Self-pay | Admitting: Podiatry

## 2018-10-16 DIAGNOSIS — M2012 Hallux valgus (acquired), left foot: Secondary | ICD-10-CM

## 2018-10-16 DIAGNOSIS — M2042 Other hammer toe(s) (acquired), left foot: Secondary | ICD-10-CM

## 2018-10-22 ENCOUNTER — Other Ambulatory Visit: Payer: Self-pay

## 2018-10-22 ENCOUNTER — Encounter: Payer: Self-pay | Admitting: Podiatry

## 2018-10-22 ENCOUNTER — Ambulatory Visit (INDEPENDENT_AMBULATORY_CARE_PROVIDER_SITE_OTHER): Payer: 59 | Admitting: Podiatry

## 2018-10-22 ENCOUNTER — Ambulatory Visit (INDEPENDENT_AMBULATORY_CARE_PROVIDER_SITE_OTHER): Payer: 59

## 2018-10-22 VITALS — Temp 97.2°F

## 2018-10-22 DIAGNOSIS — Z09 Encounter for follow-up examination after completed treatment for conditions other than malignant neoplasm: Secondary | ICD-10-CM | POA: Diagnosis not present

## 2018-10-22 DIAGNOSIS — M2012 Hallux valgus (acquired), left foot: Secondary | ICD-10-CM

## 2018-10-22 DIAGNOSIS — M2011 Hallux valgus (acquired), right foot: Secondary | ICD-10-CM

## 2018-10-25 NOTE — Progress Notes (Signed)
Subjective:   Patient ID: Stacie [name], female   DOB: 58 y.o.   MRN: [id number]   HPI Patient states doing really well with surgery and very pleased so far with how things are going   ROS      Objective:  Physical Exam  Neurovascular status intact with patient's left heel healing well wound edges well coapted hallux in rectus position with good range of motion with negative Homans' sign noted     Assessment:  Patient doing well post osteotomy left first metatarsal     Plan:  Reviewed condition and x-ray and discussed continued immobilization elevation compression and discussed correction of right foot which will be done in approximately 3 weeks and reviewed with her today.  Patient will be seen back and is encouraged to call with any questions or concerns prior to next visit  X-rays indicate that there is good healing of the osteotomy joint congruence fixation in place

## 2018-11-07 ENCOUNTER — Ambulatory Visit (INDEPENDENT_AMBULATORY_CARE_PROVIDER_SITE_OTHER): Payer: 59

## 2018-11-07 ENCOUNTER — Other Ambulatory Visit: Payer: Self-pay

## 2018-11-07 ENCOUNTER — Ambulatory Visit (INDEPENDENT_AMBULATORY_CARE_PROVIDER_SITE_OTHER): Payer: 59 | Admitting: Podiatry

## 2018-11-07 ENCOUNTER — Encounter: Payer: Self-pay | Admitting: Podiatry

## 2018-11-07 DIAGNOSIS — M2012 Hallux valgus (acquired), left foot: Secondary | ICD-10-CM

## 2018-11-07 DIAGNOSIS — Z09 Encounter for follow-up examination after completed treatment for conditions other than malignant neoplasm: Secondary | ICD-10-CM

## 2018-11-07 DIAGNOSIS — M2011 Hallux valgus (acquired), right foot: Secondary | ICD-10-CM

## 2018-11-07 DIAGNOSIS — M21611 Bunion of right foot: Secondary | ICD-10-CM | POA: Diagnosis not present

## 2018-11-07 NOTE — Patient Instructions (Signed)
Pre-Operative Instructions  Congratulations, you have decided to take an important step towards improving your quality of life.  You can be assured that the doctors and staff at [facility] will be with you every step of the way.  Here are some important things you should know:  1. Plan to be at the surgery center/hospital at least 1 (one) hour prior to your scheduled time, unless otherwise directed by the surgical center/hospital staff.  You must have a responsible adult accompany you, remain during the surgery and drive you home.  Make sure you have directions to the surgical center/hospital to ensure you arrive on time. 2. If you are having surgery at [facility] or [facility], you will need a copy of your medical history and physical form from your family physician within one month prior to the date of surgery. We will give you a form for your primary physician to complete.  3. We make every effort to accommodate the date you request for surgery.  However, there are times where surgery dates or times have to be moved.  We will contact you as soon as possible if a change in schedule is required.   4. No aspirin/ibuprofen for one week before surgery.  If you are on aspirin, any non-steroidal anti-inflammatory medications (Mobic, Aleve, Ibuprofen) should not be taken seven (7) days prior to your surgery.  You make take Tylenol for pain prior to surgery.  5. Medications - If you are taking daily heart and blood pressure medications, seizure, reflux, allergy, asthma, anxiety, pain or diabetes medications, make sure you notify the surgery center/hospital before the day of surgery so they can tell you which medications you should take or avoid the day of surgery. 6. No food or drink after midnight the night before surgery unless directed otherwise by surgical center/hospital staff. 7. No alcoholic beverages 24-hours prior to surgery.  No smoking 24-hours prior or 24-hours after  surgery. 8. Wear loose pants or shorts. They should be loose enough to fit over bandages, boots, and casts. 9. Don't wear slip-on shoes. Sneakers are preferred. 10. Bring your boot with you to the surgery center/hospital.  Also bring crutches or a walker if your physician has prescribed it for you.  If you do not have this equipment, it will be provided for you after surgery. 11. If you have not been contacted by the surgery center/hospital by the day before your surgery, call to confirm the date and time of your surgery. 12. Leave-time from work may vary depending on the type of surgery you have.  Appropriate arrangements should be made prior to surgery with your employer. 13. Prescriptions will be provided immediately following surgery by your doctor.  Fill these as soon as possible after surgery and take the medication as directed. Pain medications will not be refilled on weekends and must be approved by the doctor. 14. Remove nail polish on the operative foot and avoid getting pedicures prior to surgery. 15. Wash the night before surgery.  The night before surgery wash the foot and leg well with water and the antibacterial soap provided. Be sure to pay special attention to beneath the toenails and in between the toes.  Wash for at least three (3) minutes. Rinse thoroughly with water and dry well with a towel.  Perform this wash unless told not to do so by your physician.  Enclosed: 1 Ice pack (please put in freezer the night before surgery)   1 Hibiclens skin cleaner     Pre-op instructions  If you have any questions regarding the instructions, please do not hesitate to call our office.  [place]: [address], [place], [place] [address] -- [phone]  [place]: [address] [place], [place] [address] -- [phone]  [place]: [address]  [place],  [address] -- [phone]   Website: [email or web address] 

## 2018-11-12 NOTE — Progress Notes (Signed)
Subjective:   Patient ID: [name], female   DOB: 58 y.o.   MRN: [id number]   HPI Patient states overall doing pretty good with swelling of her foot if she is on it for too long of the time and states she wants to get the right foot fixed  ROS      Objective:  Physical Exam  Neurovascular status intact with patient's foot healing well with mild discomfort upon deep palpation and states overall she is very pleased and is looking forward to having her right foot fixed     Assessment:  Doing well post foot surgery left with chronic structural deformity of the right foot noted     Plan:  H&P conditions reviewed and reviewed x-ray left foot.  Discussed the right foot and recommended surgical intervention and allow her to read consent form going over alternative treatments complications associated with surgery.  Patient is willing to accept risk of surgery and after extensive review signed consent form reviewing all possible complications and the fact that just because 1 did so well there is no guarantee on the second foot.  Patient is scheduled for outpatient surgery and is encouraged to call with questions concerns

## 2018-11-14 DIAGNOSIS — K5900 Constipation, unspecified: Secondary | ICD-10-CM | POA: Insufficient documentation

## 2018-11-14 DIAGNOSIS — K648 Other hemorrhoids: Secondary | ICD-10-CM | POA: Insufficient documentation

## 2018-11-21 ENCOUNTER — Telehealth: Payer: Self-pay | Admitting: Podiatry

## 2018-11-21 NOTE — Telephone Encounter (Signed)
DOS: 12/04/2018  SURGICAL PROCEDURE: [place] [id number])  [facility] Policy Effective [id number] -   Prior Authorization# [id number] was Covered/Approved 11/20/2018.

## 2018-12-03 MED ORDER — ONDANSETRON HCL 4 MG PO TABS: 4.0000 mg | ORAL_TABLET | Freq: Three times a day (TID) | ORAL | 0 refills | Status: DC | PRN

## 2018-12-03 MED ORDER — OXYCODONE-ACETAMINOPHEN 10-325 MG PO TABS: 1.0000 | ORAL_TABLET | ORAL | 0 refills | Status: DC | PRN

## 2018-12-03 NOTE — Addendum Note (Signed)
Addended by: Wallene Huh on: 12/03/2018 04:58 PM   Modules accepted: Orders

## 2018-12-04 ENCOUNTER — Encounter: Payer: Self-pay | Admitting: Podiatry

## 2018-12-04 DIAGNOSIS — M2011 Hallux valgus (acquired), right foot: Secondary | ICD-10-CM

## 2018-12-10 ENCOUNTER — Ambulatory Visit (INDEPENDENT_AMBULATORY_CARE_PROVIDER_SITE_OTHER): Payer: 59

## 2018-12-10 ENCOUNTER — Other Ambulatory Visit: Payer: Self-pay

## 2018-12-10 ENCOUNTER — Encounter: Payer: Self-pay | Admitting: Podiatry

## 2018-12-10 ENCOUNTER — Ambulatory Visit (INDEPENDENT_AMBULATORY_CARE_PROVIDER_SITE_OTHER): Payer: 59 | Admitting: Podiatry

## 2018-12-10 VITALS — BP 156/95 | HR 68 | Temp 96.8°F | Resp 16

## 2018-12-10 DIAGNOSIS — M2011 Hallux valgus (acquired), right foot: Secondary | ICD-10-CM | POA: Diagnosis not present

## 2018-12-10 NOTE — Progress Notes (Signed)
Subjective:   Patient ID: Stacie [name], female   DOB: 58 y.o.   MRN: [id number]   HPI Patient states doing great with right foot and very pleased   ROS      Objective:  Physical Exam  Neurovascular status intact negative Bevelyn Buckles' sign noted with patient's right foot doing very well with minimal discomfort wound edges well coapted hallux in rectus position     Assessment:  Doing well post osteotomy right first metatarsal     Plan:  H&P reviewed condition and recommended range of motion exercises anti-inflammatories physical therapy compression to be continued and immobilization.  Reappoint in 2 to 3 weeks or earlier if needed  X-rays indicate that the osteotomy is healing well with wound edges well coapted and good alignment noted

## 2018-12-17 ENCOUNTER — Other Ambulatory Visit: Payer: Self-pay | Admitting: Podiatry

## 2018-12-17 ENCOUNTER — Other Ambulatory Visit: Payer: Self-pay

## 2018-12-17 ENCOUNTER — Ambulatory Visit (INDEPENDENT_AMBULATORY_CARE_PROVIDER_SITE_OTHER): Payer: 59

## 2018-12-17 ENCOUNTER — Ambulatory Visit (INDEPENDENT_AMBULATORY_CARE_PROVIDER_SITE_OTHER): Payer: 59 | Admitting: Podiatry

## 2018-12-17 ENCOUNTER — Encounter: Payer: Self-pay | Admitting: Podiatry

## 2018-12-17 DIAGNOSIS — T148XXA Other injury of unspecified body region, initial encounter: Secondary | ICD-10-CM

## 2018-12-17 DIAGNOSIS — Z9889 Other specified postprocedural states: Secondary | ICD-10-CM

## 2018-12-17 DIAGNOSIS — M2011 Hallux valgus (acquired), right foot: Secondary | ICD-10-CM

## 2018-12-17 NOTE — Progress Notes (Signed)
Subjective:   Patient ID: Stacie [name], female   DOB: 58 y.o.   MRN: [id number]   HPI Right foot is doing well but she was concerned about a blister on top of the metatarsal and just wanted to make sure everything was okay   ROS      Objective:  Physical Exam  Neurovascular status intact with patient's right first metatarsal healing very well wound edges well coapted and a small blister on top of the second third metatarsal localized with no drainage noted     Assessment:  Appears to be a blister that sterile with well-healed surgical site right     Plan:  Reviewed condition and recommended blister to be popped which I did today with no purulent drainage with slight clear fluid.  I applied Silvadene to it and applied sterile dressing and advised on complete immobilization to be continued and reappoint 2 weeks  X-ray indicates osteotomies healing well fixation in place good alignment

## 2018-12-24 ENCOUNTER — Encounter: Payer: 59 | Admitting: Podiatry

## 2018-12-31 ENCOUNTER — Other Ambulatory Visit: Payer: Self-pay | Admitting: Podiatry

## 2018-12-31 ENCOUNTER — Encounter: Payer: Self-pay | Admitting: Podiatry

## 2018-12-31 ENCOUNTER — Other Ambulatory Visit: Payer: Self-pay

## 2018-12-31 ENCOUNTER — Ambulatory Visit (INDEPENDENT_AMBULATORY_CARE_PROVIDER_SITE_OTHER): Payer: 59

## 2018-12-31 ENCOUNTER — Ambulatory Visit (INDEPENDENT_AMBULATORY_CARE_PROVIDER_SITE_OTHER): Payer: 59 | Admitting: Podiatry

## 2018-12-31 DIAGNOSIS — M2011 Hallux valgus (acquired), right foot: Secondary | ICD-10-CM | POA: Diagnosis not present

## 2018-12-31 DIAGNOSIS — M21611 Bunion of right foot: Secondary | ICD-10-CM

## 2019-01-02 NOTE — Progress Notes (Signed)
Subjective:   Patient ID: Stacie [name], female   DOB: 58 y.o.   MRN: [id number]   HPI Patient states overall doing pretty well but still has soreness if she is on her foot too much   ROS      Objective:  Physical Exam  Neurovascular status intact negative Stacie [name]' sign noted with patient's right foot healing well wound edges well coapted and good alignment noted with adequate range of motion.  The incision sites healing well with no drainage     Assessment:  Overall doing well post foot surgery right     Plan:  Reviewed condition recommended continued range of motion exercises gradual return to soft shoe gear and patient will be seen back to reevaluate again in 4 weeks  X-rays indicate osteotomies healing well with no indications of advanced pathology

## 2019-01-28 ENCOUNTER — Ambulatory Visit (INDEPENDENT_AMBULATORY_CARE_PROVIDER_SITE_OTHER): Payer: 59

## 2019-01-28 ENCOUNTER — Ambulatory Visit (INDEPENDENT_AMBULATORY_CARE_PROVIDER_SITE_OTHER): Payer: 59 | Admitting: Podiatry

## 2019-01-28 ENCOUNTER — Encounter: Payer: Self-pay | Admitting: Podiatry

## 2019-01-28 ENCOUNTER — Other Ambulatory Visit: Payer: Self-pay

## 2019-01-28 VITALS — Temp 97.2°F

## 2019-01-28 DIAGNOSIS — M2011 Hallux valgus (acquired), right foot: Secondary | ICD-10-CM | POA: Diagnosis not present

## 2019-01-28 DIAGNOSIS — M2012 Hallux valgus (acquired), left foot: Secondary | ICD-10-CM

## 2019-01-28 NOTE — Progress Notes (Signed)
Subjective:   Patient ID: Stacie [name], female   DOB: 59 y.o.   MRN: [id number]   HPI Patient states doing better with both feet with swelling still upon excessive activity but overall improving   ROS      Objective:  Physical Exam  Neurovascular status intact negative Bevelyn Buckles' sign noted wound edges first metatarsal bilateral healing well good range of motion no crepitus with mild deepening of the incision sites as far as discoloration     Assessment:  Doing well post surgery bilateral     Plan:  H&P reviewed conditions recommended the continuation of elevation compression as needed gradual increase in activity and patient be seen back to recheck as needed  X-rays indicates osteotomies are healing well fixation in place alignment good

## 2020-01-08 DIAGNOSIS — R921 Mammographic calcification found on diagnostic imaging of breast: Secondary | ICD-10-CM | POA: Diagnosis not present

## 2020-01-08 DIAGNOSIS — N6012 Diffuse cystic mastopathy of left breast: Secondary | ICD-10-CM | POA: Diagnosis not present

## 2020-04-14 DIAGNOSIS — E785 Hyperlipidemia, unspecified: Secondary | ICD-10-CM | POA: Diagnosis not present

## 2020-04-14 DIAGNOSIS — E039 Hypothyroidism, unspecified: Secondary | ICD-10-CM | POA: Diagnosis not present

## 2020-04-14 DIAGNOSIS — E119 Type 2 diabetes mellitus without complications: Secondary | ICD-10-CM | POA: Diagnosis not present

## 2020-04-16 DIAGNOSIS — R82998 Other abnormal findings in urine: Secondary | ICD-10-CM | POA: Diagnosis not present

## 2020-04-16 DIAGNOSIS — Z1212 Encounter for screening for malignant neoplasm of rectum: Secondary | ICD-10-CM | POA: Diagnosis not present

## 2020-04-21 DIAGNOSIS — Z0000 Encounter for general adult medical examination without abnormal findings: Secondary | ICD-10-CM | POA: Diagnosis not present

## 2020-04-21 DIAGNOSIS — H6121 Impacted cerumen, right ear: Secondary | ICD-10-CM | POA: Diagnosis not present

## 2020-04-21 DIAGNOSIS — I10 Essential (primary) hypertension: Secondary | ICD-10-CM | POA: Diagnosis not present

## 2020-04-21 DIAGNOSIS — F5104 Psychophysiologic insomnia: Secondary | ICD-10-CM | POA: Diagnosis not present

## 2020-08-31 DIAGNOSIS — R809 Proteinuria, unspecified: Secondary | ICD-10-CM | POA: Diagnosis not present

## 2020-08-31 DIAGNOSIS — I10 Essential (primary) hypertension: Secondary | ICD-10-CM | POA: Diagnosis not present

## 2020-08-31 DIAGNOSIS — E1129 Type 2 diabetes mellitus with other diabetic kidney complication: Secondary | ICD-10-CM | POA: Diagnosis not present

## 2020-09-28 DIAGNOSIS — Z1231 Encounter for screening mammogram for malignant neoplasm of breast: Secondary | ICD-10-CM | POA: Diagnosis not present

## 2020-10-06 DIAGNOSIS — R922 Inconclusive mammogram: Secondary | ICD-10-CM | POA: Diagnosis not present

## 2020-10-06 DIAGNOSIS — R928 Other abnormal and inconclusive findings on diagnostic imaging of breast: Secondary | ICD-10-CM | POA: Diagnosis not present

## 2020-10-14 DIAGNOSIS — N6081 Other benign mammary dysplasias of right breast: Secondary | ICD-10-CM | POA: Diagnosis not present

## 2020-10-14 DIAGNOSIS — N6314 Unspecified lump in the right breast, lower inner quadrant: Secondary | ICD-10-CM | POA: Diagnosis not present

## 2020-12-24 DIAGNOSIS — E039 Hypothyroidism, unspecified: Secondary | ICD-10-CM | POA: Diagnosis not present

## 2020-12-24 DIAGNOSIS — I10 Essential (primary) hypertension: Secondary | ICD-10-CM | POA: Diagnosis not present

## 2020-12-24 DIAGNOSIS — E1129 Type 2 diabetes mellitus with other diabetic kidney complication: Secondary | ICD-10-CM | POA: Diagnosis not present

## 2021-03-25 DIAGNOSIS — E1129 Type 2 diabetes mellitus with other diabetic kidney complication: Secondary | ICD-10-CM | POA: Diagnosis not present

## 2021-03-25 DIAGNOSIS — E1151 Type 2 diabetes mellitus with diabetic peripheral angiopathy without gangrene: Secondary | ICD-10-CM | POA: Diagnosis not present

## 2021-04-28 DIAGNOSIS — R928 Other abnormal and inconclusive findings on diagnostic imaging of breast: Secondary | ICD-10-CM | POA: Diagnosis not present

## 2021-06-11 DIAGNOSIS — E785 Hyperlipidemia, unspecified: Secondary | ICD-10-CM | POA: Diagnosis not present

## 2021-06-11 DIAGNOSIS — E1129 Type 2 diabetes mellitus with other diabetic kidney complication: Secondary | ICD-10-CM | POA: Diagnosis not present

## 2021-06-11 DIAGNOSIS — E039 Hypothyroidism, unspecified: Secondary | ICD-10-CM | POA: Diagnosis not present

## 2021-06-14 DIAGNOSIS — Z0000 Encounter for general adult medical examination without abnormal findings: Secondary | ICD-10-CM | POA: Diagnosis not present

## 2021-06-17 DIAGNOSIS — R82998 Other abnormal findings in urine: Secondary | ICD-10-CM | POA: Diagnosis not present

## 2021-06-17 DIAGNOSIS — Z1331 Encounter for screening for depression: Secondary | ICD-10-CM | POA: Diagnosis not present

## 2021-06-17 DIAGNOSIS — Z0000 Encounter for general adult medical examination without abnormal findings: Secondary | ICD-10-CM | POA: Diagnosis not present

## 2021-06-17 DIAGNOSIS — Z1339 Encounter for screening examination for other mental health and behavioral disorders: Secondary | ICD-10-CM | POA: Diagnosis not present

## 2021-06-17 DIAGNOSIS — E039 Hypothyroidism, unspecified: Secondary | ICD-10-CM | POA: Diagnosis not present

## 2021-10-04 DIAGNOSIS — Z1231 Encounter for screening mammogram for malignant neoplasm of breast: Secondary | ICD-10-CM | POA: Diagnosis not present

## 2021-12-01 DIAGNOSIS — K5730 Diverticulosis of large intestine without perforation or abscess without bleeding: Secondary | ICD-10-CM | POA: Diagnosis not present

## 2021-12-01 DIAGNOSIS — K648 Other hemorrhoids: Secondary | ICD-10-CM | POA: Diagnosis not present

## 2021-12-01 DIAGNOSIS — Z09 Encounter for follow-up examination after completed treatment for conditions other than malignant neoplasm: Secondary | ICD-10-CM | POA: Diagnosis not present

## 2021-12-01 DIAGNOSIS — Z86010 Personal history of colonic polyps: Secondary | ICD-10-CM | POA: Diagnosis not present

## 2022-02-01 DIAGNOSIS — E1151 Type 2 diabetes mellitus with diabetic peripheral angiopathy without gangrene: Secondary | ICD-10-CM | POA: Diagnosis not present

## 2022-02-01 DIAGNOSIS — I10 Essential (primary) hypertension: Secondary | ICD-10-CM | POA: Diagnosis not present

## 2022-06-04 DIAGNOSIS — E119 Type 2 diabetes mellitus without complications: Secondary | ICD-10-CM | POA: Diagnosis not present

## 2022-07-04 DIAGNOSIS — E119 Type 2 diabetes mellitus without complications: Secondary | ICD-10-CM | POA: Diagnosis not present

## 2022-08-04 DIAGNOSIS — E119 Type 2 diabetes mellitus without complications: Secondary | ICD-10-CM | POA: Diagnosis not present

## 2022-08-25 DIAGNOSIS — E039 Hypothyroidism, unspecified: Secondary | ICD-10-CM | POA: Diagnosis not present

## 2022-08-25 DIAGNOSIS — E1129 Type 2 diabetes mellitus with other diabetic kidney complication: Secondary | ICD-10-CM | POA: Diagnosis not present

## 2022-09-04 DIAGNOSIS — E119 Type 2 diabetes mellitus without complications: Secondary | ICD-10-CM | POA: Diagnosis not present

## 2022-09-20 DIAGNOSIS — Z1331 Encounter for screening for depression: Secondary | ICD-10-CM | POA: Diagnosis not present

## 2022-09-20 DIAGNOSIS — E1151 Type 2 diabetes mellitus with diabetic peripheral angiopathy without gangrene: Secondary | ICD-10-CM | POA: Diagnosis not present

## 2022-09-20 DIAGNOSIS — Z0000 Encounter for general adult medical examination without abnormal findings: Secondary | ICD-10-CM | POA: Diagnosis not present

## 2022-09-20 DIAGNOSIS — R82998 Other abnormal findings in urine: Secondary | ICD-10-CM | POA: Diagnosis not present

## 2022-09-20 DIAGNOSIS — Z1339 Encounter for screening examination for other mental health and behavioral disorders: Secondary | ICD-10-CM | POA: Diagnosis not present

## 2022-09-20 DIAGNOSIS — I10 Essential (primary) hypertension: Secondary | ICD-10-CM | POA: Diagnosis not present

## 2022-10-04 DIAGNOSIS — E119 Type 2 diabetes mellitus without complications: Secondary | ICD-10-CM | POA: Diagnosis not present

## 2022-10-10 DIAGNOSIS — Z1231 Encounter for screening mammogram for malignant neoplasm of breast: Secondary | ICD-10-CM | POA: Diagnosis not present

## 2022-11-04 DIAGNOSIS — E119 Type 2 diabetes mellitus without complications: Secondary | ICD-10-CM | POA: Diagnosis not present

## 2022-12-04 DIAGNOSIS — E119 Type 2 diabetes mellitus without complications: Secondary | ICD-10-CM | POA: Diagnosis not present

## 2023-01-04 DIAGNOSIS — E119 Type 2 diabetes mellitus without complications: Secondary | ICD-10-CM | POA: Diagnosis not present

## 2023-02-04 DIAGNOSIS — E119 Type 2 diabetes mellitus without complications: Secondary | ICD-10-CM | POA: Diagnosis not present

## 2023-03-04 DIAGNOSIS — E119 Type 2 diabetes mellitus without complications: Secondary | ICD-10-CM | POA: Diagnosis not present

## 2023-04-04 DIAGNOSIS — E119 Type 2 diabetes mellitus without complications: Secondary | ICD-10-CM | POA: Diagnosis not present

## 2023-05-04 DIAGNOSIS — E119 Type 2 diabetes mellitus without complications: Secondary | ICD-10-CM | POA: Diagnosis not present

## 2023-06-04 DIAGNOSIS — E119 Type 2 diabetes mellitus without complications: Secondary | ICD-10-CM | POA: Diagnosis not present

## 2023-07-04 DIAGNOSIS — E119 Type 2 diabetes mellitus without complications: Secondary | ICD-10-CM | POA: Diagnosis not present

## 2023-08-04 DIAGNOSIS — E119 Type 2 diabetes mellitus without complications: Secondary | ICD-10-CM | POA: Diagnosis not present

## 2023-08-11 DIAGNOSIS — H9193 Unspecified hearing loss, bilateral: Secondary | ICD-10-CM | POA: Diagnosis not present

## 2023-08-11 DIAGNOSIS — H6123 Impacted cerumen, bilateral: Secondary | ICD-10-CM | POA: Diagnosis not present

## 2023-09-04 DIAGNOSIS — E119 Type 2 diabetes mellitus without complications: Secondary | ICD-10-CM | POA: Diagnosis not present

## 2023-09-21 DIAGNOSIS — I10 Essential (primary) hypertension: Secondary | ICD-10-CM | POA: Diagnosis not present

## 2023-09-21 DIAGNOSIS — E039 Hypothyroidism, unspecified: Secondary | ICD-10-CM | POA: Diagnosis not present

## 2023-09-21 DIAGNOSIS — E1151 Type 2 diabetes mellitus with diabetic peripheral angiopathy without gangrene: Secondary | ICD-10-CM | POA: Diagnosis not present

## 2023-09-27 DIAGNOSIS — Z1212 Encounter for screening for malignant neoplasm of rectum: Secondary | ICD-10-CM | POA: Diagnosis not present

## 2023-09-28 DIAGNOSIS — E1151 Type 2 diabetes mellitus with diabetic peripheral angiopathy without gangrene: Secondary | ICD-10-CM | POA: Diagnosis not present

## 2023-09-28 DIAGNOSIS — Z0000 Encounter for general adult medical examination without abnormal findings: Secondary | ICD-10-CM | POA: Diagnosis not present

## 2023-09-28 DIAGNOSIS — I10 Essential (primary) hypertension: Secondary | ICD-10-CM | POA: Diagnosis not present

## 2023-09-28 DIAGNOSIS — R82998 Other abnormal findings in urine: Secondary | ICD-10-CM | POA: Diagnosis not present

## 2023-09-28 DIAGNOSIS — Z1331 Encounter for screening for depression: Secondary | ICD-10-CM | POA: Diagnosis not present

## 2023-09-28 DIAGNOSIS — Z1339 Encounter for screening examination for other mental health and behavioral disorders: Secondary | ICD-10-CM | POA: Diagnosis not present

## 2023-10-04 DIAGNOSIS — E119 Type 2 diabetes mellitus without complications: Secondary | ICD-10-CM | POA: Diagnosis not present

## 2023-10-16 DIAGNOSIS — Z1231 Encounter for screening mammogram for malignant neoplasm of breast: Secondary | ICD-10-CM | POA: Diagnosis not present

## 2023-11-04 DIAGNOSIS — E119 Type 2 diabetes mellitus without complications: Secondary | ICD-10-CM | POA: Diagnosis not present
